# Patient Record
Sex: Male | Born: 1965 | Race: Black or African American | Hispanic: No | Smoking: Current every day smoker
Health system: Southern US, Community
[De-identification: ages and names within clinical notes are randomized; demographics above are authoritative.]

## PROBLEM LIST (undated history)

## (undated) DIAGNOSIS — R7303 Prediabetes: Secondary | ICD-10-CM

## (undated) DIAGNOSIS — J45909 Unspecified asthma, uncomplicated: Secondary | ICD-10-CM

## (undated) DIAGNOSIS — L409 Psoriasis, unspecified: Secondary | ICD-10-CM

## (undated) DIAGNOSIS — I10 Essential (primary) hypertension: Secondary | ICD-10-CM

## (undated) HISTORY — PX: LEG SURGERY: SHX1003

---

## 2005-05-15 ENCOUNTER — Emergency Department: Payer: Self-pay | Admitting: Emergency Medicine

## 2005-05-17 ENCOUNTER — Emergency Department: Payer: Self-pay | Admitting: Emergency Medicine

## 2007-05-22 ENCOUNTER — Emergency Department: Payer: Self-pay | Admitting: Emergency Medicine

## 2010-09-03 ENCOUNTER — Emergency Department: Payer: Self-pay | Admitting: Emergency Medicine

## 2012-01-10 ENCOUNTER — Emergency Department: Payer: Self-pay | Admitting: Emergency Medicine

## 2012-01-14 ENCOUNTER — Emergency Department: Payer: Self-pay | Admitting: Emergency Medicine

## 2012-01-14 LAB — CBC
HGB: 12.6 g/dL — ABNORMAL LOW (ref 13.0–18.0)
MCH: 31.1 pg (ref 26.0–34.0)
MCV: 95 fL (ref 80–100)
RBC: 4.03 10*6/uL — ABNORMAL LOW (ref 4.40–5.90)
RDW: 14 % (ref 11.5–14.5)
WBC: 4.7 10*3/uL (ref 3.8–10.6)

## 2012-01-14 LAB — BASIC METABOLIC PANEL WITH GFR
Anion Gap: 9
BUN: 8 mg/dL
Calcium, Total: 8.6 mg/dL
Chloride: 104 mmol/L
Co2: 27 mmol/L
Creatinine: 1.1 mg/dL
EGFR (African American): >60
EGFR (Non-African Amer.): >60
Glucose: 85 mg/dL
Osmolality: 277
Potassium: 4.3 mmol/L
Sodium: 140 mmol/L

## 2012-01-30 ENCOUNTER — Ambulatory Visit: Payer: Self-pay

## 2012-02-16 ENCOUNTER — Encounter: Payer: Self-pay | Admitting: Cardiothoracic Surgery

## 2012-03-29 DIAGNOSIS — E559 Vitamin D deficiency, unspecified: Secondary | ICD-10-CM | POA: Insufficient documentation

## 2012-03-29 DIAGNOSIS — Z125 Encounter for screening for malignant neoplasm of prostate: Secondary | ICD-10-CM | POA: Insufficient documentation

## 2012-03-29 DIAGNOSIS — F17200 Nicotine dependence, unspecified, uncomplicated: Secondary | ICD-10-CM | POA: Insufficient documentation

## 2012-03-29 DIAGNOSIS — I10 Essential (primary) hypertension: Secondary | ICD-10-CM | POA: Insufficient documentation

## 2012-03-29 DIAGNOSIS — Z833 Family history of diabetes mellitus: Secondary | ICD-10-CM | POA: Insufficient documentation

## 2012-03-29 DIAGNOSIS — Z8042 Family history of malignant neoplasm of prostate: Secondary | ICD-10-CM | POA: Insufficient documentation
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
## 2013-12-05 DIAGNOSIS — D649 Anemia, unspecified: Secondary | ICD-10-CM | POA: Insufficient documentation

## 2013-12-05 DIAGNOSIS — Z8371 Family history of colonic polyps: Secondary | ICD-10-CM | POA: Insufficient documentation

## 2015-06-17 DIAGNOSIS — L409 Psoriasis, unspecified: Secondary | ICD-10-CM | POA: Insufficient documentation

## 2015-06-17 DIAGNOSIS — L239 Allergic contact dermatitis, unspecified cause: Secondary | ICD-10-CM | POA: Insufficient documentation

## 2015-07-01 DIAGNOSIS — Z789 Other specified health status: Secondary | ICD-10-CM | POA: Insufficient documentation

## 2015-07-01 DIAGNOSIS — Z7289 Other problems related to lifestyle: Secondary | ICD-10-CM | POA: Insufficient documentation

## 2015-07-01 DIAGNOSIS — N529 Male erectile dysfunction, unspecified: Secondary | ICD-10-CM | POA: Insufficient documentation

## 2015-11-09 ENCOUNTER — Encounter: Payer: Self-pay | Admitting: Specialist

## 2015-11-09 ENCOUNTER — Inpatient Hospital Stay
Admission: EM | Admit: 2015-11-09 | Discharge: 2015-11-12 | DRG: 603 | Disposition: A | Discharge status: Home / Self Care | Payer: Self-pay | Attending: Internal Medicine | Admitting: Internal Medicine

## 2015-11-09 ENCOUNTER — Emergency Department: Payer: Self-pay

## 2015-11-09 DIAGNOSIS — L409 Psoriasis, unspecified: Secondary | ICD-10-CM | POA: Diagnosis present

## 2015-11-09 DIAGNOSIS — Z809 Family history of malignant neoplasm, unspecified: Secondary | ICD-10-CM

## 2015-11-09 DIAGNOSIS — F17200 Nicotine dependence, unspecified, uncomplicated: Secondary | ICD-10-CM | POA: Diagnosis present

## 2015-11-09 DIAGNOSIS — R21 Rash and other nonspecific skin eruption: Secondary | ICD-10-CM | POA: Diagnosis present

## 2015-11-09 DIAGNOSIS — B9789 Other viral agents as the cause of diseases classified elsewhere: Secondary | ICD-10-CM | POA: Diagnosis present

## 2015-11-09 DIAGNOSIS — L0390 Cellulitis, unspecified: Secondary | ICD-10-CM | POA: Diagnosis present

## 2015-11-09 DIAGNOSIS — I10 Essential (primary) hypertension: Secondary | ICD-10-CM | POA: Diagnosis present

## 2015-11-09 DIAGNOSIS — L03115 Cellulitis of right lower limb: Principal | ICD-10-CM | POA: Diagnosis present

## 2015-11-09 DIAGNOSIS — Z833 Family history of diabetes mellitus: Secondary | ICD-10-CM

## 2015-11-09 HISTORY — DX: Psoriasis, unspecified: L40.9

## 2015-11-09 HISTORY — DX: Essential (primary) hypertension: I10

## 2015-11-09 LAB — CBC WITH DIFFERENTIAL/PLATELET
Basophils Absolute: 0.1 10*3/uL (ref 0–0.1)
Basophils Relative: 1 %
Eosinophils Absolute: 0.1 10*3/uL (ref 0–0.7)
Eosinophils Relative: 1 %
HCT: 32.3 % — ABNORMAL LOW (ref 40.0–52.0)
Hemoglobin: 11 g/dL — ABNORMAL LOW (ref 13.0–18.0)
LYMPHS PCT: 20 %
Lymphs Abs: 1.4 10*3/uL (ref 1.0–3.6)
MCH: 30.4 pg (ref 26.0–34.0)
MCHC: 34 g/dL (ref 32.0–36.0)
MCV: 89.4 fL (ref 80.0–100.0)
MONO ABS: 0.9 10*3/uL (ref 0.2–1.0)
Monocytes Relative: 13 %
NEUTROS ABS: 4.4 10*3/uL (ref 1.4–6.5)
Neutrophils Relative %: 65 %
Platelets: 439 10*3/uL (ref 150–440)
RBC: 3.61 MIL/uL — AB (ref 4.40–5.90)
RDW: 13.8 % (ref 11.5–14.5)
WBC: 6.8 10*3/uL (ref 3.8–10.6)

## 2015-11-09 LAB — BASIC METABOLIC PANEL
ANION GAP: 5 (ref 5–15)
BUN: 6 mg/dL (ref 6–20)
CHLORIDE: 105 mmol/L (ref 101–111)
CO2: 26 mmol/L (ref 22–32)
Calcium: 8.6 mg/dL — ABNORMAL LOW (ref 8.9–10.3)
Creatinine, Ser: 0.63 mg/dL (ref 0.61–1.24)
GFR calc Af Amer: >60 mL/min (ref >60)
GFR calc non Af Amer: >60 mL/min (ref >60)
GLUCOSE: 87 mg/dL (ref 65–99)
POTASSIUM: 3.3 mmol/L — AB (ref 3.5–5.1)
Sodium: 136 mmol/L (ref 135–145)

## 2015-11-09 MED ORDER — ONDANSETRON HCL 4 MG/2ML IJ SOLN: 4.0000 mg | Freq: Four times a day (QID) | INTRAMUSCULAR | Status: DC | PRN

## 2015-11-09 MED ORDER — HYDROCODONE-ACETAMINOPHEN 5-325 MG PO TABS
1.0000 | ORAL_TABLET | ORAL | Status: DC | PRN
  Administered 2015-11-09 – 2015-11-11 (×2): 1 via ORAL
  Filled 2015-11-09: qty 1
  Filled 2015-11-09: qty 2

## 2015-11-09 MED ORDER — HYDROCHLOROTHIAZIDE 12.5 MG PO CAPS
12.5000 mg | ORAL_CAPSULE | Freq: Every day | ORAL | Status: DC
  Administered 2015-11-09 – 2015-11-11 (×3): 12.5 mg via ORAL
  Filled 2015-11-09 (×3): qty 1

## 2015-11-09 MED ORDER — ONDANSETRON HCL 4 MG PO TABS: 4.0000 mg | ORAL_TABLET | Freq: Four times a day (QID) | ORAL | Status: DC | PRN

## 2015-11-09 MED ORDER — TRIAMCINOLONE ACETONIDE 0.5 % EX CREA
1.0000 "application " | TOPICAL_CREAM | Freq: Three times a day (TID) | CUTANEOUS | Status: DC
  Administered 2015-11-09 – 2015-11-11 (×6): 1 via TOPICAL
  Filled 2015-11-09 (×2): qty 15

## 2015-11-09 MED ORDER — VANCOMYCIN HCL IN DEXTROSE 1-5 GM/200ML-% IV SOLN
1000.0000 mg | Freq: Two times a day (BID) | INTRAVENOUS | Status: DC
  Administered 2015-11-10: 1000 mg via INTRAVENOUS
  Filled 2015-11-09 (×3): qty 200

## 2015-11-09 MED ORDER — MORPHINE SULFATE (PF) 2 MG/ML IV SOLN
1.0000 mg | INTRAVENOUS | Status: DC | PRN
  Administered 2015-11-09: 1 mg via INTRAVENOUS
  Filled 2015-11-09: qty 1

## 2015-11-09 MED ORDER — ENOXAPARIN SODIUM 40 MG/0.4ML ~~LOC~~ SOLN
40.0000 mg | SUBCUTANEOUS | Status: DC
  Administered 2015-11-09 – 2015-11-11 (×3): 40 mg via SUBCUTANEOUS
  Filled 2015-11-09 (×3): qty 0.4

## 2015-11-09 MED ORDER — VANCOMYCIN HCL IN DEXTROSE 1-5 GM/200ML-% IV SOLN
1000.0000 mg | Freq: Once | INTRAVENOUS | Status: AC
  Administered 2015-11-09: 1000 mg via INTRAVENOUS
  Filled 2015-11-09: qty 200

## 2015-11-09 MED ORDER — ACETAMINOPHEN 325 MG PO TABS
650.0000 mg | ORAL_TABLET | Freq: Four times a day (QID) | ORAL | Status: DC | PRN
Start: 2015-11-09 — End: 2015-11-12

## 2015-11-09 MED ORDER — ACETAMINOPHEN 650 MG RE SUPP: 650.0000 mg | Freq: Four times a day (QID) | RECTAL | Status: DC | PRN

## 2015-11-09 NOTE — H&P (Signed)
[place] [facility] [place] NAME: Jonathan Garza    MR#:  [id number]  DATE OF BIRTH:  17-Jul-1966  DATE OF ADMISSION:  11/09/2015  PRIMARY CARE PHYSICIAN: [facility]   REQUESTING/REFERRING PHYSICIAN: Dr. Larae Grooms  CHIEF COMPLAINT:   Chief Complaint  Patient presents with  . Leg Swelling    HISTORY OF PRESENT ILLNESS:  Jonathan Garza  is a 50 y.o. male with a known history of psoriasis, hypertension who presents to the hospital due to right lower extremity redness swelling and pain progressively getting worse over the past week. he has history of psoriasis and has dry excoriating rash to his lower extremities, but for the last week he has had significant redness and swelling to his right lower extremity and therefore came to the ER for further evaluation. He denies any fevers chills, chest pain, shortness of breath, nausea, vomiting, cough, chills or any other associated symptoms. He underwent a ultrasound of his lower extremities which was negative for DVT. Clinically he does have evidence of right lower extremity cellulitis and therefore hospitalist services were contacted for further treatment and evaluation.  PAST MEDICAL HISTORY:   Past Medical History  Diagnosis Date  . Essential hypertension   . Psoriasis     PAST SURGICAL HISTORY:  No past surgical history on file.  SOCIAL HISTORY:   Social History  Substance Use Topics  . Smoking status: Not on file  . Smokeless tobacco: Not on file  . Alcohol Use: Not on file    FAMILY HISTORY:   Family History  Problem Relation Age of Onset  . Diabetes Father   . Diabetes Mother   . Cancer Father     DRUG ALLERGIES:  No Known Allergies  REVIEW OF SYSTEMS:   Review of Systems  Constitutional: Negative for fever and weight loss.  HENT: Negative for congestion, nosebleeds and tinnitus.   Eyes: Negative for blurred vision, double vision and redness.  Respiratory:  Negative for cough, hemoptysis and shortness of breath.   Cardiovascular: Positive for leg swelling (Right Leg). Negative for chest pain, orthopnea and PND.  Gastrointestinal: Negative for nausea, vomiting, abdominal pain, diarrhea and melena.  Genitourinary: Negative for dysuria, urgency and hematuria.  Musculoskeletal: Negative for joint pain and falls.  Neurological: Negative for dizziness, tingling, sensory change, focal weakness, seizures, weakness and headaches.  Endo/Heme/Allergies: Negative for polydipsia. Does not bruise/bleed easily.  Psychiatric/Behavioral: Negative for depression and memory loss. The patient is not nervous/anxious.     MEDICATIONS AT HOME:   Prior to Admission medications   Not on File      VITAL SIGNS:  Blood pressure 146/104, pulse 74, temperature 97.8 F (36.6 C), temperature source Oral, resp. rate 18, height 5\' 5"  (1.651 m), weight 69.854 kg (154 lb), SpO2 98 %.  PHYSICAL EXAMINATION:  Physical Exam  GENERAL:  50 y.o.-year-old patient lying in the bed in no acute distress.  EYES: Pupils equal, round, reactive to light and accommodation. No scleral icterus. Extraocular muscles intact.  HEENT: Head atraumatic, normocephalic. Oropharynx and nasopharynx clear. No oropharyngeal erythema, moist oral mucosa  NECK:  Supple, no jugular venous distention. No thyroid enlargement, no tenderness.  LUNGS: Normal breath sounds bilaterally, no wheezing, rales, rhonchi. No use of accessory muscles of respiration.  CARDIOVASCULAR: S1, S2 RRR. No murmurs, rubs, gallops, clicks.  ABDOMEN: Soft, nontender, nondistended. Bowel sounds present. No organomegaly or mass.  EXTREMITIES: RLE + 1 edema > Left, No  cyanosis, or clubbing. +  2 pedal & radial pulses b/l.   NEUROLOGIC: Cranial nerves II through XII are intact. No focal Motor or sensory deficits appreciated b/l PSYCHIATRIC: The patient is alert and oriented x 3. Good affect.  SKIN: Dry excoriating rash on the lower  extremities bilaterally. Extensively on the right heel and foot. No lesion, or ulcer.   LABORATORY PANEL:   CBC  Recent Labs Lab 11/09/15 1246  WBC 6.8  HGB 11.0*  HCT 32.3*  PLT 439   ------------------------------------------------------------------------------------------------------------------  Chemistries  No results for input(s): NA, K, CL, CO2, GLUCOSE, BUN, CREATININE, CALCIUM, MG, AST, ALT, ALKPHOS, BILITOT in the last 168 hours.  Invalid input(s): GFRCGP ------------------------------------------------------------------------------------------------------------------  Cardiac Enzymes No results for input(s): TROPONINI in the last 168 hours. ------------------------------------------------------------------------------------------------------------------  RADIOLOGY:  US Venous Img Lower Unilateral Right  11/09/2015  CLINICAL DATA:  50 year old male with swelling, redness and pain in the right lower extremity for the past week. EXAM: RIGHT LOWER EXTREMITY VENOUS DOPPLER ULTRASOUND TECHNIQUE: Gray-scale sonography with graded compression, as well as color Doppler and duplex ultrasound were performed to evaluate the lower extremity deep venous systems from the level of the common femoral vein and including the common femoral, femoral, profunda femoral, popliteal and calf veins including the posterior tibial, peroneal and gastrocnemius veins when visible. The superficial great saphenous vein was also interrogated. Spectral Doppler was utilized to evaluate flow at rest and with distal augmentation maneuvers in the common femoral, femoral and popliteal veins. COMPARISON:  No priors. FINDINGS: Contralateral Common Femoral Vein: Respiratory phasicity is normal and symmetric with the symptomatic side. No evidence of thrombus. Normal compressibility. Common Femoral Vein: No evidence of thrombus. Normal compressibility, respiratory phasicity and response to augmentation. Saphenofemoral  Junction: No evidence of thrombus. Normal compressibility and flow on color Doppler imaging. Profunda Femoral Vein: No evidence of thrombus. Normal compressibility and flow on color Doppler imaging. Femoral Vein: No evidence of thrombus. Normal compressibility, respiratory phasicity and response to augmentation. Popliteal Vein: No evidence of thrombus. Normal compressibility, respiratory phasicity and response to augmentation. Calf Veins: No evidence of thrombus. Normal compressibility and flow on color Doppler imaging. Superficial Great Saphenous Vein: No evidence of thrombus. Normal compressibility and flow on color Doppler imaging. Venous Reflux:  None. Other Findings: Nonocclusive echogenic material in the distal right greater saphenous vein, compatible nonocclusive superficial thrombus. IMPRESSION: 1. No evidence of right lower extremity deep venous thrombosis. 2. However, there does appear to be a small nonocclusive thrombus in the distal right greater saphenous vein. Electronically Signed   By: Vinnie Langton M.D.   On: 11/09/2015 12:33     IMPRESSION AND PLAN:   50 year old male with past medical history of hypertension, psoriasis who presents to the hospital due to right lower extremity pain and redness and swelling and noted to have a right lower extremity cellulitis.  #1 right lower extremity cellulitis-we will start the patient on IV vancomycin. -Follow blood and wound cultures. I will get a wound team consult. -Continue supportive care. Patient is afebrile with a normal white cell count.  #2 essential hypertension-continue HCTZ.  #3 psoriasis-patient likely needs referral to dermatology as an outpatient.   All the records are reviewed and case discussed with ED provider. Management plans discussed with the patient, family and they are in agreement.  CODE STATUS: Full  TOTAL TIME TAKING CARE OF THIS PATIENT: 45 minutes.    Henreitta Leber M.D on 11/09/2015 at 1:57 PM  Between  7am to 6pm - Pager - [phone]  After 6pm  go to [email or web address] - password [email or web address] [facility]  [facility]  Office  [phone]  CC: Primary care physician; No primary care provider on file.

## 2015-11-09 NOTE — ED Provider Notes (Signed)
[facility] Emergency Department Provider Note  ____________________________________________  Time seen: Approximately [id number] PM  I have reviewed the triage vital signs and the nursing notes.   HISTORY  Chief Complaint Leg Swelling    HPI Jonathan Garza is a 50 y.o. male with a history of psoriasis and hypertension who is presenting to the emergency department today with right lower extremity swelling and pain. He says that it started last week and was given ibuprofen and an outpatient office but the swelling and pain continued to worsen. He says that the swelling has extended all the way up to his groin at this point. He denies any fever. Says that he also has psoriasis to his bilateral feet which is worsened over the last 1-2 weeks. He says that in the past she has been prescribed a cream for this which has improved his symptoms.   No past medical history on file.  There are no active problems to display for this patient.   No past surgical history on file.  No current outpatient prescriptions on file.  Allergies Review of patient's allergies indicates no known allergies.  No family history on file.  Social History Social History  Substance Use Topics  . Smoking status: Not on file  . Smokeless tobacco: Not on file  . Alcohol Use: Not on file    Review of Systems Constitutional: No fever/chills Eyes: No visual changes. ENT: No sore throat. Cardiovascular: Denies chest pain. Respiratory: Denies shortness of breath. Gastrointestinal: No abdominal pain.  No nausea, no vomiting.  No diarrhea.  No constipation. Genitourinary: Negative for dysuria. Musculoskeletal: Negative for back pain. Skin: Redness and pain to the right lower extremity. Neurological: Negative for headaches, focal weakness or numbness.  10-point ROS otherwise negative.  ____________________________________________   PHYSICAL EXAM:  VITAL SIGNS: ED Triage Vitals  Enc  Vitals Group     BP 11/09/15 1132 146/104 mmHg     Pulse Rate 11/09/15 1132 74     Resp 11/09/15 1132 18     Temp 11/09/15 1132 97.8 F (36.6 C)     Temp Source 11/09/15 1132 Oral     SpO2 11/09/15 1132 98 %     Weight 11/09/15 1132 154 lb (69.854 kg)     Height 11/09/15 1132 5\' 5"  (1.651 m)     Head Cir --      Peak Flow --      Pain Score 11/09/15 1133 5     Pain Loc --      Pain Edu? --      Excl. in [place]? --     Constitutional: Alert and oriented. Well appearing and in no acute distress. Eyes: Conjunctivae are normal. PERRL. EOMI. Head: Atraumatic. Nose: No congestion/rhinnorhea. Mouth/Throat: Mucous membranes are moist.  Oropharynx non-erythematous. Neck: No stridor.   Cardiovascular: Normal rate, regular rhythm. Grossly normal heart sounds.  Good peripheral circulation. Respiratory: Normal respiratory effort.  No retractions. Lungs CTAB. Gastrointestinal: Soft and nontender. No distention. No abdominal bruits. No CVA tenderness. Musculoskeletal: Right lower extremity edematous foot as well as the calf. There is induration to the posterior calf as well as extending up the right posterior thigh into the groin. The erythema surrounds the entirety of the calf. It is tender to touch. No crepitus. Also warm to the touch. Neurologic:  Normal speech and language. No gross focal neurologic deficits are appreciated. No gait instability. Skin: See musculoskeletal exam. Also on the bilateral heels are plaque-like lesions extending around the heel.  They're white and disjointed. The patient says that these with lesions consistent with psoriasis. He also has several plaque-like lesions on the palms of his bilateral hands. Psychiatric: Mood and affect are normal. Speech and behavior are normal.  ____________________________________________   LABS (all labs ordered are listed, but only abnormal results are displayed)  Labs Reviewed  CBC WITH DIFFERENTIAL/PLATELET - Abnormal; Notable for the  following:    RBC 3.61 (*)    Hemoglobin 11.0 (*)    HCT 32.3 (*)    All other components within normal limits  BASIC METABOLIC PANEL   ____________________________________________  EKG   ____________________________________________  RADIOLOGY  Negative for DVT to the right lower extremity. Saphenous vein thrombus present. ____________________________________________   PROCEDURES    ____________________________________________   INITIAL IMPRESSION / ASSESSMENT AND PLAN / ED COURSE  Pertinent labs & imaging results that were available during my care of the patient were reviewed by me and considered in my medical decision making (see chart for details).  ----------------------------------------- 1:15 PM on 11/09/2015 -----------------------------------------  Patient is resting comfortably at this time. However, he has evidence of cellulitis greater than 50% of the right lower extremity. He will be admitted to the hospital. I discussed with the patient as well as family who are at the bedside. Signed out to Dr. Verdell Carmine of the medicine service. ____________________________________________   FINAL CLINICAL IMPRESSION(S) / ED DIAGNOSES  Right lower extremity cellulitis.    Orbie Pyo, MD 11/09/15 1315

## 2015-11-09 NOTE — ED Notes (Signed)
Pt reports right leg and calf swelling that started this week. Redness noted around shin and calf

## 2015-11-09 NOTE — Progress Notes (Signed)
Pharmacy Antibiotic Note  Jonathan Garza is a 50 y.o. male admitted on 11/09/2015 with cellulitis.  Pharmacy has been consulted for Vancomycin dosing.  Plan: Patient received Vancomycin 1000 mg IV once in ED at 1300.  Will order a stacked dose of 1000 mg IV once for 9 hours later.  Vancomycin 1000 IV every 12 hours.  Goal trough 10-15 mcg/mL.  Will order trough prior to 4th total dose (including stacked dose) on 3/12 at 2130 (should be at steady state).   Ke: 0.085, t1/2: 8.15 h, Vd: 48.3 L   Height: 5\' 5"  (165.1 cm) Weight: 154 lb (69.854 kg) IBW/kg (Calculated) : 61.5  Temp (24hrs), Avg:97.8 F (36.6 C), Min:97.8 F (36.6 C), Max:97.8 F (36.6 C)   Recent Labs Lab 11/09/15 1246  WBC 6.8  CREATININE 0.63    Estimated Creatinine Clearance: 97.2 mL/min (by C-G formula based on Cr of 0.63).    No Known Allergies  Antimicrobials this admission: 3/11 Vancomycin >>    Dose adjustments this admission: Trough on 3/12 at 2130  Microbiology results: No cultures pending at this time.  Thank you for allowing pharmacy to be a part of this patient's care.  Jonathan Garza G 11/09/2015 2:58 PM

## 2015-11-10 LAB — CBC
HCT: 29.9 % — ABNORMAL LOW (ref 40.0–52.0)
Hemoglobin: 10.3 g/dL — ABNORMAL LOW (ref 13.0–18.0)
MCH: 31.1 pg (ref 26.0–34.0)
MCHC: 34.5 g/dL (ref 32.0–36.0)
MCV: 90.3 fL (ref 80.0–100.0)
PLATELETS: 399 10*3/uL (ref 150–440)
RBC: 3.31 MIL/uL — AB (ref 4.40–5.90)
RDW: 13.7 % (ref 11.5–14.5)
WBC: 6.4 10*3/uL (ref 3.8–10.6)

## 2015-11-10 LAB — BASIC METABOLIC PANEL
Anion gap: 7 (ref 5–15)
BUN: 8 mg/dL (ref 6–20)
CALCIUM: 8.1 mg/dL — AB (ref 8.9–10.3)
CO2: 28 mmol/L (ref 22–32)
CREATININE: 0.63 mg/dL (ref 0.61–1.24)
Chloride: 98 mmol/L — ABNORMAL LOW (ref 101–111)
GFR calc non Af Amer: >60 mL/min (ref >60)
Glucose, Bld: 119 mg/dL — ABNORMAL HIGH (ref 65–99)
Potassium: 3 mmol/L — ABNORMAL LOW (ref 3.5–5.1)
SODIUM: 133 mmol/L — AB (ref 135–145)

## 2015-11-10 LAB — VANCOMYCIN, TROUGH: VANCOMYCIN TR: 9 ug/mL — AB (ref 10–20)

## 2015-11-10 MED ORDER — NICOTINE 21 MG/24HR TD PT24
21.0000 mg | MEDICATED_PATCH | Freq: Every day | TRANSDERMAL | Status: DC
  Administered 2015-11-10 – 2015-11-11 (×2): 21 mg via TRANSDERMAL
  Filled 2015-11-10 (×2): qty 1

## 2015-11-10 MED ORDER — VANCOMYCIN HCL 10 G IV SOLR
1250.0000 mg | Freq: Two times a day (BID) | INTRAVENOUS | Status: DC
  Administered 2015-11-11 (×2): 1250 mg via INTRAVENOUS
  Filled 2015-11-10 (×3): qty 1250

## 2015-11-10 MED ORDER — LABETALOL HCL 5 MG/ML IV SOLN
10.0000 mg | INTRAVENOUS | Status: DC | PRN
  Filled 2015-11-10: qty 4

## 2015-11-10 NOTE — Progress Notes (Addendum)
Manual blood pressure 146/104. Dr Jannifer Franklin notified. Order will be placed for prn med

## 2015-11-10 NOTE — Progress Notes (Signed)
[place] at [place] NAME: Jonathan Garza    MR#:  [id number]  DATE OF BIRTH:  1966/03/25  SUBJECTIVE:  CHIEF COMPLAINT:   Chief Complaint  Patient presents with  . Leg Swelling     Have cellulitis on right leg, IV Vanc started, helped some, still redness and warm present.  REVIEW OF SYSTEMS:  CONSTITUTIONAL: No fever, fatigue or weakness.  EYES: No blurred or double vision.  EARS, NOSE, AND THROAT: No tinnitus or ear pain.  RESPIRATORY: No cough, shortness of breath, wheezing or hemoptysis.  CARDIOVASCULAR: No chest pain, orthopnea, edema.  GASTROINTESTINAL: No nausea, vomiting, diarrhea or abdominal pain.  GENITOURINARY: No dysuria, hematuria.  ENDOCRINE: No polyuria, nocturia,  HEMATOLOGY: No anemia, easy bruising or bleeding SKIN: No rash or lesion. MUSCULOSKELETAL: No joint pain or arthritis.  Right leg swelling and redness. NEUROLOGIC: No tingling, numbness, weakness.  PSYCHIATRY: No anxiety or depression.   ROS  DRUG ALLERGIES:  No Known Allergies  VITALS:  Blood pressure 151/103, pulse 69, temperature 99.3 F (37.4 C), temperature source Oral, resp. rate 18, height 5\' 5"  (1.651 m), weight 69.854 kg (154 lb), SpO2 100 %.  PHYSICAL EXAMINATION:   GENERAL: 50 y.o.-year-old patient lying in the bed in no acute distress.  EYES: Pupils equal, round, reactive to light and accommodation. No scleral icterus. Extraocular muscles intact.  HEENT: Head atraumatic, normocephalic. Oropharynx and nasopharynx clear. No oropharyngeal erythema, moist oral mucosa  NECK: Supple, no jugular venous distention. No thyroid enlargement, no tenderness.  LUNGS: Normal breath sounds bilaterally, no wheezing, rales, rhonchi. No use of accessory muscles of respiration.  CARDIOVASCULAR: S1, S2 RRR. No murmurs, rubs, gallops, clicks.  ABDOMEN: Soft, nontender, nondistended. Bowel sounds present. No organomegaly or mass.  EXTREMITIES:  RLE + 1 edema > Left, No cyanosis, or clubbing. + 2 pedal & radial pulses b/l.  NEUROLOGIC: Cranial nerves II through XII are intact. No focal Motor or sensory deficits appreciated b/l PSYCHIATRIC: The patient is alert and oriented x 3. Good affect.  SKIN: Dry excoriating rash on the lower extremities bilaterally. Extensively on the right heel and foot. No lesion, or ulcer.   Physical Exam LABORATORY PANEL:   CBC  Recent Labs Lab 11/10/15 0328  WBC 6.4  HGB 10.3*  HCT 29.9*  PLT 399   ------------------------------------------------------------------------------------------------------------------  Chemistries   Recent Labs Lab 11/10/15 0328  NA 133*  K 3.0*  CL 98*  CO2 28  GLUCOSE 119*  BUN 8  CREATININE 0.63  CALCIUM 8.1*   ------------------------------------------------------------------------------------------------------------------  Cardiac Enzymes No results for input(s): TROPONINI in the last 168 hours. ------------------------------------------------------------------------------------------------------------------  RADIOLOGY:  US Venous Img Lower Unilateral Right  11/09/2015  CLINICAL DATA:  50 year old male with swelling, redness and pain in the right lower extremity for the past week. EXAM: RIGHT LOWER EXTREMITY VENOUS DOPPLER ULTRASOUND TECHNIQUE: Gray-scale sonography with graded compression, as well as color Doppler and duplex ultrasound were performed to evaluate the lower extremity deep venous systems from the level of the common femoral vein and including the common femoral, femoral, profunda femoral, popliteal and calf veins including the posterior tibial, peroneal and gastrocnemius veins when visible. The superficial great saphenous vein was also interrogated. Spectral Doppler was utilized to evaluate flow at rest and with distal augmentation maneuvers in the common femoral, femoral and popliteal veins. COMPARISON:  No priors. FINDINGS:  Contralateral Common Femoral Vein: Respiratory phasicity is normal and symmetric with the symptomatic side. No evidence of thrombus. Normal  compressibility. Common Femoral Vein: No evidence of thrombus. Normal compressibility, respiratory phasicity and response to augmentation. Saphenofemoral Junction: No evidence of thrombus. Normal compressibility and flow on color Doppler imaging. Profunda Femoral Vein: No evidence of thrombus. Normal compressibility and flow on color Doppler imaging. Femoral Vein: No evidence of thrombus. Normal compressibility, respiratory phasicity and response to augmentation. Popliteal Vein: No evidence of thrombus. Normal compressibility, respiratory phasicity and response to augmentation. Calf Veins: No evidence of thrombus. Normal compressibility and flow on color Doppler imaging. Superficial Great Saphenous Vein: No evidence of thrombus. Normal compressibility and flow on color Doppler imaging. Venous Reflux:  None. Other Findings: Nonocclusive echogenic material in the distal right greater saphenous vein, compatible nonocclusive superficial thrombus. IMPRESSION: 1. No evidence of right lower extremity deep venous thrombosis. 2. However, there does appear to be a small nonocclusive thrombus in the distal right greater saphenous vein. Electronically Signed   By: Vinnie Langton M.D.   On: 11/09/2015 12:33    ASSESSMENT AND PLAN:   Active Problems:   Cellulitis  #1 right lower extremity cellulitis-  on IV vancomycin. -Follow blood and wound cultures.   wound team consult. -Continue supportive care. Patient is afebrile with a normal white cell count.    No DVT.  #2 essential hypertension-continue HCTZ.  #3 psoriasis-patient likely needs referral to dermatology as an outpatient.   All the records are reviewed and case discussed with Care Management/Social Workerr. Management plans discussed with the patient, family and they are in agreement.  CODE STATUS:  full  TOTAL TIME TAKING CARE OF THIS PATIENT: 35 minutes.   POSSIBLE D/C IN 1-2 DAYS, DEPENDING ON CLINICAL CONDITION.   Vaughan Basta M.D on 11/10/2015   Between 7am to 6pm - Pager - [phone]  After 6pm go to [email or web address] - password [email or web address] [facility]  [facility]  Office  [phone]  CC: Primary care physician; No primary care provider on file.  Note: This dictation was prepared with Dragon dictation along with smaller phrase technology. Any transcriptional errors that result from this process are unintentional.

## 2015-11-10 NOTE — Progress Notes (Signed)
Notifired Matt in pharmacy vanc trough of 9

## 2015-11-10 NOTE — Progress Notes (Signed)
Pt sodium 133, K+ 3.0. Dr. Anselm Jungling notified, will place orders when assessed.

## 2015-11-10 NOTE — Progress Notes (Signed)
Pharmacy Antibiotic Note  Jonathan Garza is a 50 y.o. male admitted on 11/09/2015 with cellulitis.  Pharmacy has been consulted for Vancomycin dosing.  Plan: Patient received Vancomycin 1000 mg IV once in ED at 1300.  Will order a stacked dose of 1000 mg IV once for 9 hours later.  Vancomycin 1000 IV every 12 hours.  Goal trough 10-15 mcg/mL.  Will order trough prior to 4th total dose (including stacked dose) on 3/12 at 2130 (should be at steady state).   3/12 PM vanc level 9. Changed to 1250 mg q 12 hours. Level before 4th new dose.  Ke: 0.085, t1/2: 8.15 h, Vd: 48.3 L   Height: 5\' 5"  (165.1 cm) Weight: 154 lb (69.854 kg) IBW/kg (Calculated) : 61.5  Temp (24hrs), Avg:98.6 F (37 C), Min:98 F (36.7 C), Max:99.3 F (37.4 C)   Recent Labs Lab 11/09/15 1246 11/10/15 0328 11/10/15 2124  WBC 6.8 6.4  --   CREATININE 0.63 0.63  --   VANCOTROUGH  --   --  9*    Estimated Creatinine Clearance: 97.2 mL/min (by C-G formula based on Cr of 0.63).    No Known Allergies  Antimicrobials this admission: 3/11 Vancomycin >>    Dose adjustments this admission: Trough on 3/12 at 2130  Microbiology results: No cultures pending at this time.  Thank you for allowing pharmacy to be a part of this patient's care.  Mallika Sanmiguel S 11/10/2015 10:41 PM

## 2015-11-11 LAB — BASIC METABOLIC PANEL
ANION GAP: 6 (ref 5–15)
BUN: 10 mg/dL (ref 6–20)
CALCIUM: 8.7 mg/dL — AB (ref 8.9–10.3)
CHLORIDE: 97 mmol/L — AB (ref 101–111)
CO2: 30 mmol/L (ref 22–32)
Creatinine, Ser: 0.72 mg/dL (ref 0.61–1.24)
GFR calc Af Amer: >60 mL/min (ref >60)
GFR calc non Af Amer: >60 mL/min (ref >60)
GLUCOSE: 102 mg/dL — AB (ref 65–99)
Potassium: 3.7 mmol/L (ref 3.5–5.1)
Sodium: 133 mmol/L — ABNORMAL LOW (ref 135–145)

## 2015-11-11 MED ORDER — NICOTINE 21 MG/24HR TD PT24: 21.0000 mg | MEDICATED_PATCH | Freq: Every day | TRANSDERMAL | Status: DC

## 2015-11-11 MED ORDER — AMOXICILLIN-POT CLAVULANATE 500-125 MG PO TABS
1.0000 | ORAL_TABLET | Freq: Three times a day (TID) | ORAL | Status: DC
Start: 2015-11-11 — End: 2015-11-12
  Administered 2015-11-11 – 2015-11-12 (×3): 500 mg via ORAL
  Filled 2015-11-11 (×3): qty 1

## 2015-11-11 NOTE — Progress Notes (Signed)
[place] at [place] NAME: Jonathan Garza    MR#:  [id number]  DATE OF BIRTH:  17-Jun-1966  SUBJECTIVE:  CHIEF COMPLAINT:   Chief Complaint  Patient presents with  . Leg Swelling     Have cellulitis on right leg, IV Vanc started, Clinically improving, still with some right leg swelling and pain  REVIEW OF SYSTEMS:  CONSTITUTIONAL: No fever, fatigue or weakness.  EYES: No blurred or double vision.  EARS, NOSE, AND THROAT: No tinnitus or ear pain.  RESPIRATORY: No cough, shortness of breath, wheezing or hemoptysis.  CARDIOVASCULAR: No chest pain, orthopnea, edema.  GASTROINTESTINAL: No nausea, vomiting, diarrhea or abdominal pain.  GENITOURINARY: No dysuria, hematuria.  ENDOCRINE: No polyuria, nocturia,  HEMATOLOGY: No anemia, easy bruising or bleeding SKIN: No rash or lesion. MUSCULOSKELETAL: No joint pain or arthritis.  Right leg swelling and redness. NEUROLOGIC: No tingling, numbness, weakness.  PSYCHIATRY: No anxiety or depression.   ROS  DRUG ALLERGIES:  No Known Allergies  VITALS:  Blood pressure 130/87, pulse 66, temperature 98.4 F (36.9 C), temperature source Oral, resp. rate 18, height 5\' 5"  (1.651 m), weight 69.854 kg (154 lb), SpO2 97 %.  PHYSICAL EXAMINATION:   GENERAL: 50 y.o.-year-old patient lying in the bed in no acute distress.  EYES: Pupils equal, round, reactive to light and accommodation. No scleral icterus. Extraocular muscles intact.  HEENT: Head atraumatic, normocephalic. Oropharynx and nasopharynx clear. No oropharyngeal erythema, moist oral mucosa  NECK: Supple, no jugular venous distention. No thyroid enlargement, no tenderness.  LUNGS: Normal breath sounds bilaterally, no wheezing, rales, rhonchi. No use of accessory muscles of respiration.  CARDIOVASCULAR: S1, S2 RRR. No murmurs, rubs, gallops, clicks.  ABDOMEN: Soft, nontender, nondistended. Bowel sounds present. No organomegaly or  mass.  EXTREMITIES: RLE + 1 edema > Left, No cyanosis, or clubbing. + 2 pedal & radial pulses b/l.  NEUROLOGIC: Cranial nerves II through XII are intact. No focal Motor or sensory deficits appreciated b/l PSYCHIATRIC: The patient is alert and oriented x 3. Good affect.  SKIN: Dry excoriating rash on the lower extremities bilaterally. Extensively on the right heel and foot. No lesion, or ulcer.   Physical Exam LABORATORY PANEL:   CBC  Recent Labs Lab 11/10/15 0328  WBC 6.4  HGB 10.3*  HCT 29.9*  PLT 399   ------------------------------------------------------------------------------------------------------------------  Chemistries   Recent Labs Lab 11/10/15 0328  NA 133*  K 3.0*  CL 98*  CO2 28  GLUCOSE 119*  BUN 8  CREATININE 0.63  CALCIUM 8.1*   ------------------------------------------------------------------------------------------------------------------  Cardiac Enzymes No results for input(s): TROPONINI in the last 168 hours. ------------------------------------------------------------------------------------------------------------------  RADIOLOGY:  No results found.  ASSESSMENT AND PLAN:   Active Problems:   Cellulitis  #1 right lower extremity cellulitis-  Clinically improving ,on IV vancomycin changed to by mouth -Could not find any blood and wound cultures.   wound team consult. -Continue supportive care. Patient is afebrile with a normal white cell count.    No DVT.  #2 essential hypertension-continue HCTZ.  #3 psoriasis-patient likely needs outpatient dermatology follow-up    #tobacco abuse disorder Counseled patient to quit smoking for 3-4 minutes. He is planning to quit smoking and requested nicotine patch. Start nicotine patch     All the recor follow-upds are reviewed and case discussed with Care Management/Social Workerr. Management plans discussed with the patient, family and they are in agreement.  CODE STATUS:  full  TOTAL TIME TAKING CARE OF THIS  PATIENT: 33 minutes.   POSSIBLE D/C IN am DAYS, DEPENDING ON CLINICAL CONDITION.   Nicholes Mango M.D on 11/11/2015   Between 7am to 6pm - Pager - [phone]  After 6pm go to [email or web address] - password [email or web address] [facility]  [place] [facility]  Office  [phone]  CC: Primary care physician; No primary care provider on file.  Note: This dictation was prepared with Dragon dictation along with smaller phrase technology. Any transcriptional errors that result from this process are unintentional.

## 2015-11-11 NOTE — Care Management Note (Signed)
Case Management Note  Patient Details  Name: Jonathan Garza MRN: [id number] Date of Birth: [birth year]  Subjective/Objective:        Uninsured 50yo Mr Jonathan Garza was admitted 11/09/15 with a diagnosis of right leg cellulitis.  He resides with his Mother who can provide transportation to appointments. PCP=[facility] [employer] [facility]. Pharmacy= [facility]. He has no home assistive equipment,no home health services except for a pair of crutches, and no home oxygen. He reports that he has an appointment with an unknown podiatrist in May 2017. Case management will follow for discharge planning.            Action/Plan:   Expected Discharge Date:                  Expected Discharge Plan:     In-House Referral:     Discharge planning Services     Post Acute Care Choice:    Choice offered to:     DME Arranged:    DME Agency:     HH Arranged:    [place] Agency:     Status of Service:     Medicare Important Message Given:    Date Medicare IM Given:    Medicare IM give by:    Date Additional Medicare IM Given:    Additional Medicare Important Message give by:     If discussed at [place] of Stay Meetings, dates discussed:    Additional Comments:  Berlie Hatchel A, RN 11/11/2015, 3:04 PM

## 2015-11-12 MED ORDER — TRIAMCINOLONE ACETONIDE 0.5 % EX CREA: 1.0000 "application " | TOPICAL_CREAM | Freq: Two times a day (BID) | CUTANEOUS | Status: DC

## 2015-11-12 MED ORDER — NICOTINE 21 MG/24HR TD PT24: 21.0000 mg | MEDICATED_PATCH | Freq: Every day | TRANSDERMAL | Status: DC

## 2015-11-12 MED ORDER — ACETAMINOPHEN 325 MG PO TABS: 650.0000 mg | ORAL_TABLET | Freq: Four times a day (QID) | ORAL | Status: DC | PRN

## 2015-11-12 MED ORDER — AMOXICILLIN-POT CLAVULANATE 500-125 MG PO TABS: 1.0000 | ORAL_TABLET | Freq: Three times a day (TID) | ORAL | Status: AC

## 2015-11-12 NOTE — Progress Notes (Signed)
[place], [place].   11/12/2015  Patient: Jonathan Garza   Date of Birth:  [birth year]  Date of admission:  11/09/2015  Date of Discharge  11/12/2015    To Whom it May Concern:   Lula Portocarrero  may return to work on 11/12/15 evening shift.  PHYSICAL ACTIVITY:  Full  If you have any questions or concerns, please don't hesitate to call.  Sincerely,   Nicholes Mango M.D Pager [phone] Office : [phone]   .

## 2015-11-12 NOTE — Discharge Summary (Signed)
[place] at [place] NAME: Jonathan Garza    MR#:  [id number]  DATE OF BIRTH:  Sep 17, 1965  DATE OF ADMISSION:  11/09/2015 ADMITTING PHYSICIAN: Henreitta Leber, MD  DATE OF DISCHARGE: 11/12/2015 PRIMARY CARE PHYSICIAN: No primary care provider on file.    ADMISSION DIAGNOSIS:  Cellulitis of right lower extremity [id number]  DISCHARGE DIAGNOSIS:  Active Problems:   Cellulitis   SECONDARY DIAGNOSIS:   Past Medical History  Diagnosis Date  . Essential hypertension   . Psoriasis     HOSPITAL COURSE:   1 right lower extremity cellulitis- Clinically improving ,on IV vancomycin changed to by mouth augmentin  -Could not find any blood and wound cultures. -Continue supportive care. Patient is afebrile with a normal white cell count.   No DVT.  #2 essential hypertension-continue HCTZ.  #3 psoriasis-patient likely needs outpatient dermatology follow-up   #tobacco abuse disorder Counseled patient to quit smoking for 3-4 minutes. He is planning to quit smoking and requested nicotine patch. Start nicotine patch   DISCHARGE CONDITIONS:   fair  CONSULTS OBTAINED:  Treatment Team:  Nicholes Mango, MD   PROCEDURES none  DRUG ALLERGIES:  No Known Allergies  DISCHARGE MEDICATIONS:   Current Discharge Medication List    START taking these medications   Details  acetaminophen (TYLENOL) 325 MG tablet Take 2 tablets (650 mg total) by mouth every 6 (six) hours as needed for mild pain (or Fever >/= 101).    amoxicillin-clavulanate (AUGMENTIN) 500-125 MG tablet Take 1 tablet (500 mg total) by mouth every 8 (eight) hours. Qty: 15 tablet, Refills: 0    nicotine (NICODERM CQ - DOSED IN MG/24 HOURS) 21 mg/24hr patch Place 1 patch (21 mg total) onto the skin daily. Qty: 28 patch, Refills: 0      CONTINUE these medications which have CHANGED   Details  triamcinolone cream (KENALOG) 0.5 % Apply 1 application topically 2 (two)  times daily. Qty: 30 g, Refills: 2      CONTINUE these medications which have NOT CHANGED   Details  hydrochlorothiazide (MICROZIDE) 12.5 MG capsule Take 12.5 mg by mouth daily. Refills: 1    ibuprofen (ADVIL,MOTRIN) 600 MG tablet Take 600 mg by mouth every 6 (six) hours as needed. for pain Refills: 2         DISCHARGE INSTRUCTIONS:   Activity as tolerated Low-salt diet Follow-up with primary care physician in a week   DIET:  Low salt  DISCHARGE CONDITION:  Fair  ACTIVITY:  Activity as tolerated  OXYGEN:  Home Oxygen: No.   Oxygen Delivery: room air  DISCHARGE LOCATION:  home   If you experience worsening of your admission symptoms, develop shortness of breath, life threatening emergency, suicidal or homicidal thoughts you must seek medical attention immediately by calling 911 or calling your MD immediately  if symptoms less severe.  You Must read complete instructions/literature along with all the possible adverse reactions/side effects for all the Medicines you take and that have been prescribed to you. Take any new Medicines after you have completely understood and accpet all the possible adverse reactions/side effects.   Please note  You were cared for by a hospitalist during your hospital stay. If you have any questions about your discharge medications or the care you received while you were in the hospital after you are discharged, you can call the unit and asked to speak with the hospitalist on call if the hospitalist that took care of you  is not available. Once you are discharged, your primary care physician will handle any further medical issues. Please note that NO REFILLS for any discharge medications will be authorized once you are discharged, as it is imperative that you return to your primary care physician (or establish a relationship with a primary care physician if you do not have one) for your aftercare needs so that they can reassess your need for  medications and monitor your lab values.     Today  Chief Complaint  Patient presents with  . Leg Swelling   Patient is doing fine. Redness and swelling in the left leg improved. Ambulating fine per RN and wants to be discharged home   ROS:  CONSTITUTIONAL: Denies fevers, chills. Denies any fatigue, weakness.  EYES: Denies blurry vision, double vision, eye pain. EARS, NOSE, THROAT: Denies tinnitus, ear pain, hearing loss. RESPIRATORY: Denies cough, wheeze, shortness of breath.  CARDIOVASCULAR: Denies chest pain, palpitations, edema.  GASTROINTESTINAL: Denies nausea, vomiting, diarrhea, abdominal pain. Denies bright red blood per rectum. GENITOURINARY: Denies dysuria, hematuria. ENDOCRINE: Denies nocturia or thyroid problems. HEMATOLOGIC AND LYMPHATIC: Denies easy bruising or bleeding. SKIN: RLE erythema, edema and tenderness are significantly improved . Positive psoriatic patches  MUSCULOSKELETAL: Denies pain in neck, back, shoulder, knees, hips or arthritic symptoms.  NEUROLOGIC: Denies paralysis, paresthesias.  PSYCHIATRIC: Denies anxiety or depressive symptoms.   VITAL SIGNS:  Blood pressure 129/91, pulse 60, temperature 98.2 F (36.8 C), temperature source Oral, resp. rate 18, height 5\' 5"  (1.651 m), weight 69.854 kg (154 lb), SpO2 100 %.  I/O:   Intake/Output Summary (Last 24 hours) at 11/12/15 1320 Last data filed at 11/12/15 1300  Gross per 24 hour  Intake   1200 ml  Output   3060 ml  Net  -1860 ml    PHYSICAL EXAMINATION:  GENERAL:  50 y.o.-year-old patient lying in the bed with no acute distress.  EYES: Pupils equal, round, reactive to light and accommodation. No scleral icterus. Extraocular muscles intact.  HEENT: Head atraumatic, normocephalic. Oropharynx and nasopharynx clear.  NECK:  Supple, no jugular venous distention. No thyroid enlargement, no tenderness.  LUNGS: Normal breath sounds bilaterally, no wheezing, rales,rhonchi or crepitation. No use of  accessory muscles of respiration.  CARDIOVASCULAR: S1, S2 normal. No murmurs, rubs, or gallops.  ABDOMEN: Soft, non-tender, non-distended. Bowel sounds present. No organomegaly or mass.  EXTREMITIES: No pedal edema, cyanosis, or clubbing.  NEUROLOGIC: Cranial nerves II through XII are intact. Muscle strength 5/5 in all extremities. Sensation intact. Gait not checked.  PSYCHIATRIC: The patient is alert and oriented x 3.  SKIN:  RLE erythema, edema and tenderness are significantly improved . Positive psoriatic patches  DATA REVIEW:   CBC  Recent Labs Lab 11/10/15 0328  WBC 6.4  HGB 10.3*  HCT 29.9*  PLT 399    Chemistries   Recent Labs Lab 11/11/15 1436  NA 133*  K 3.7  CL 97*  CO2 30  GLUCOSE 102*  BUN 10  CREATININE 0.72  CALCIUM 8.7*    Cardiac Enzymes No results for input(s): TROPONINI in the last 168 hours.  Microbiology Results  No results found for this or any previous visit.  RADIOLOGY:  US Venous Img Lower Unilateral Right  11/09/2015  CLINICAL DATA:  50 year old male with swelling, redness and pain in the right lower extremity for the past week. EXAM: RIGHT LOWER EXTREMITY VENOUS DOPPLER ULTRASOUND TECHNIQUE: Gray-scale sonography with graded compression, as well as color Doppler and duplex ultrasound were performed to  evaluate the lower extremity deep venous systems from the level of the common femoral vein and including the common femoral, femoral, profunda femoral, popliteal and calf veins including the posterior tibial, peroneal and gastrocnemius veins when visible. The superficial great saphenous vein was also interrogated. Spectral Doppler was utilized to evaluate flow at rest and with distal augmentation maneuvers in the common femoral, femoral and popliteal veins. COMPARISON:  No priors. FINDINGS: Contralateral Common Femoral Vein: Respiratory phasicity is normal and symmetric with the symptomatic side. No evidence of thrombus. Normal compressibility.  Common Femoral Vein: No evidence of thrombus. Normal compressibility, respiratory phasicity and response to augmentation. Saphenofemoral Junction: No evidence of thrombus. Normal compressibility and flow on color Doppler imaging. Profunda Femoral Vein: No evidence of thrombus. Normal compressibility and flow on color Doppler imaging. Femoral Vein: No evidence of thrombus. Normal compressibility, respiratory phasicity and response to augmentation. Popliteal Vein: No evidence of thrombus. Normal compressibility, respiratory phasicity and response to augmentation. Calf Veins: No evidence of thrombus. Normal compressibility and flow on color Doppler imaging. Superficial Great Saphenous Vein: No evidence of thrombus. Normal compressibility and flow on color Doppler imaging. Venous Reflux:  None. Other Findings: Nonocclusive echogenic material in the distal right greater saphenous vein, compatible nonocclusive superficial thrombus. IMPRESSION: 1. No evidence of right lower extremity deep venous thrombosis. 2. However, there does appear to be a small nonocclusive thrombus in the distal right greater saphenous vein. Electronically Signed   By: Vinnie Langton M.D.   On: 11/09/2015 12:33    EKG:  No orders found for this or any previous visit.    Management plans discussed with the patient, family and they are in agreement.  CODE STATUS:     Code Status Orders        Start     Ordered   11/09/15 1507  Full code   Continuous     11/09/15 1506    Code Status History    Date Active Date Inactive Code Status Order ID Comments User Context   This patient has a current code status but no historical code status.      TOTAL TIME TAKING CARE OF THIS PATIENT: 45 minutes.    @[facility] @  on 11/12/2015 at 1:20 PM  Between 7am to 6pm - Pager - [phone]  After 6pm go to [email or web address] - password [email or web address] [facility]  [place] [facility]  Office  [phone]  CC: Primary care physician; No primary care  provider on file.

## 2015-11-12 NOTE — Discharge Instructions (Signed)
Activity as tolerated Low-salt diet Follow-up with primary care physician in a week

## 2015-12-18 DIAGNOSIS — S143XXA Injury of brachial plexus, initial encounter: Secondary | ICD-10-CM | POA: Insufficient documentation

## 2015-12-18 DIAGNOSIS — T1490XA Injury, unspecified, initial encounter: Secondary | ICD-10-CM | POA: Insufficient documentation

## 2015-12-23 DIAGNOSIS — S52301A Unspecified fracture of shaft of right radius, initial encounter for closed fracture: Secondary | ICD-10-CM | POA: Insufficient documentation

## 2015-12-23 DIAGNOSIS — S62639B Displaced fracture of distal phalanx of unspecified finger, initial encounter for open fracture: Secondary | ICD-10-CM | POA: Insufficient documentation

## 2015-12-23 DIAGNOSIS — S51001A Unspecified open wound of right elbow, initial encounter: Secondary | ICD-10-CM | POA: Insufficient documentation

## 2015-12-23 DIAGNOSIS — S42131A Displaced fracture of coracoid process, right shoulder, initial encounter for closed fracture: Secondary | ICD-10-CM | POA: Insufficient documentation

## 2016-08-31 HISTORY — PX: FRACTURE SURGERY: SHX138

## 2017-01-12 ENCOUNTER — Other Ambulatory Visit: Payer: Self-pay | Admitting: Nurse Practitioner

## 2017-01-12 ENCOUNTER — Ambulatory Visit
Admission: RE | Admit: 2017-01-12 | Discharge: 2017-01-12 | Disposition: A | Discharge status: Home / Self Care | Payer: Medicaid Other | Source: Ambulatory Visit | Attending: Nurse Practitioner | Admitting: Nurse Practitioner

## 2017-01-12 DIAGNOSIS — R1031 Right lower quadrant pain: Secondary | ICD-10-CM | POA: Insufficient documentation

## 2017-06-08 IMAGING — US US EXTREM LOW VENOUS*R*
1 series · 13 of 24 positions shown · non-contrast
Comparison: No priors.

CLINICAL DATA: 49-year-old male with swelling, redness and pain in
the right lower extremity for the past week.



[Series 1: us extrem low venous*right* · 0.08mm/px · 13 of 36 slices shown]
[im 1/36]
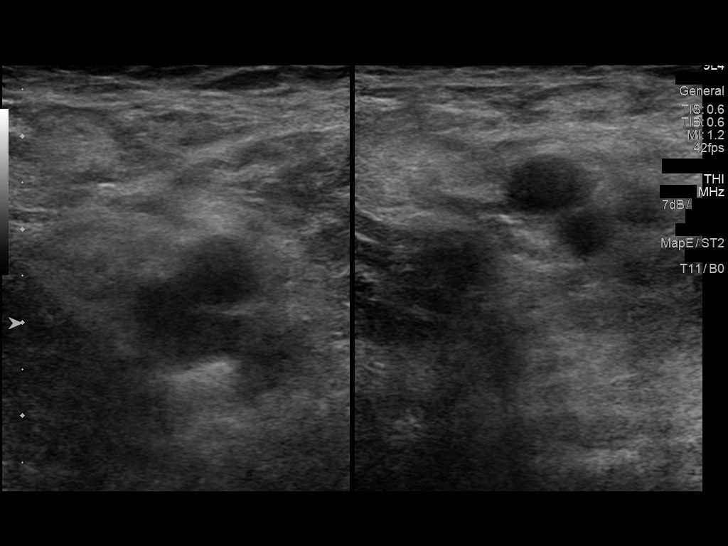
[im 4/36]
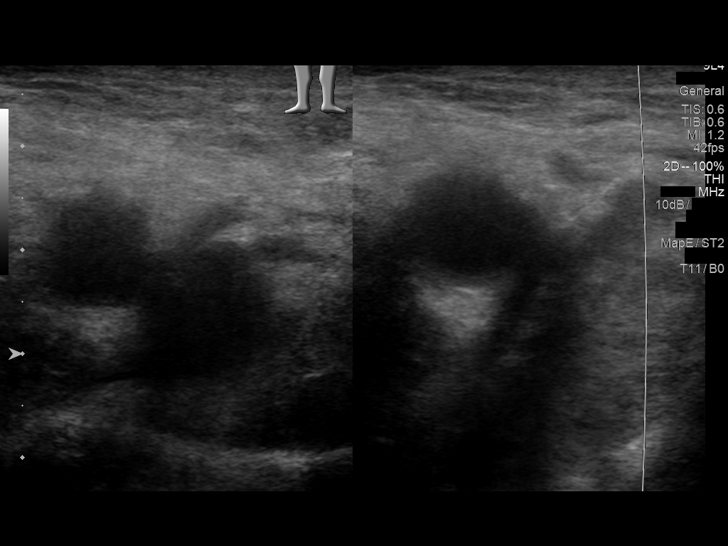
[im 7/36]
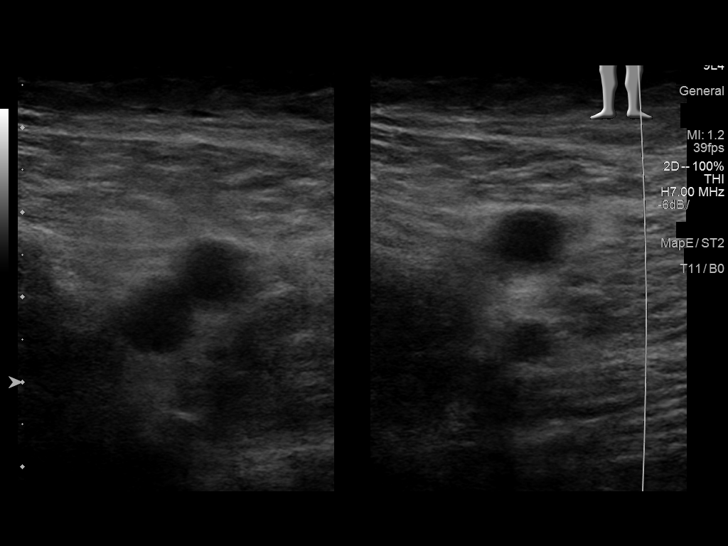
[im 10/36]
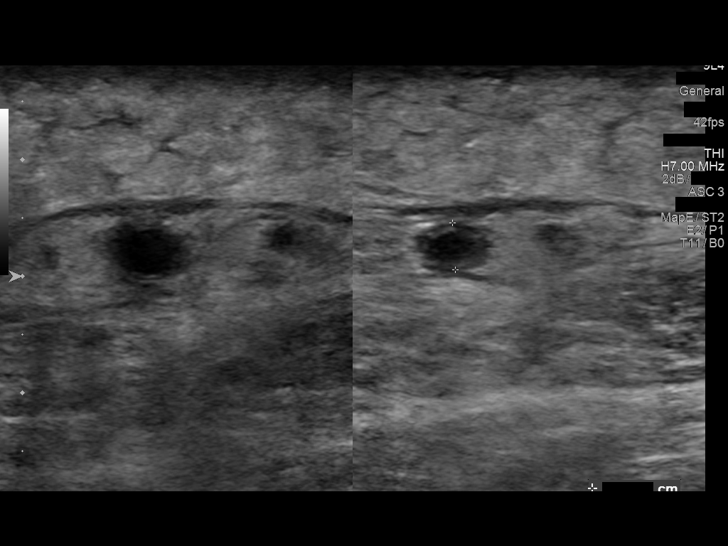
[im 13/36]
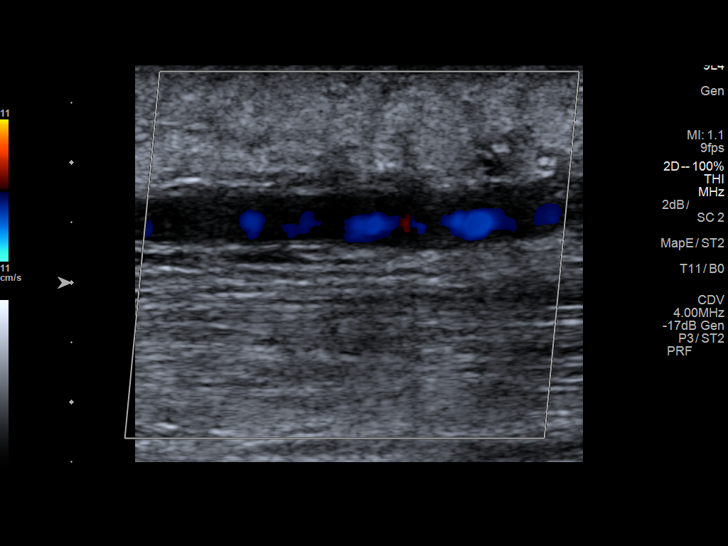
[im 16/36]
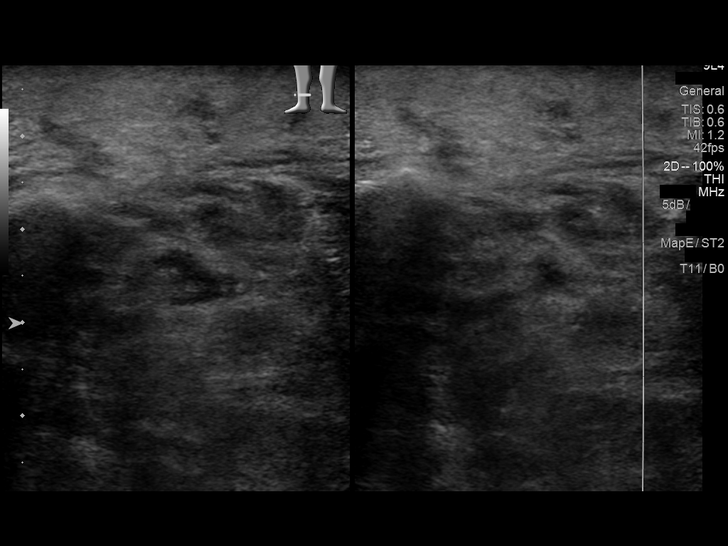
[im 19/36]
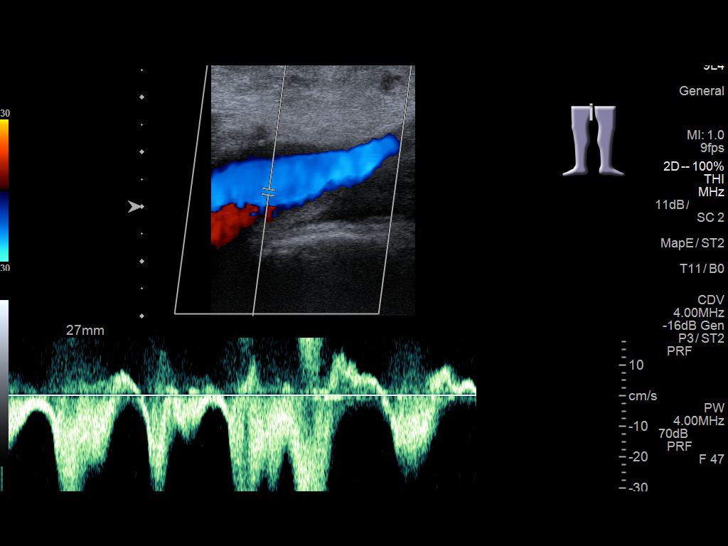
[im 20/36]
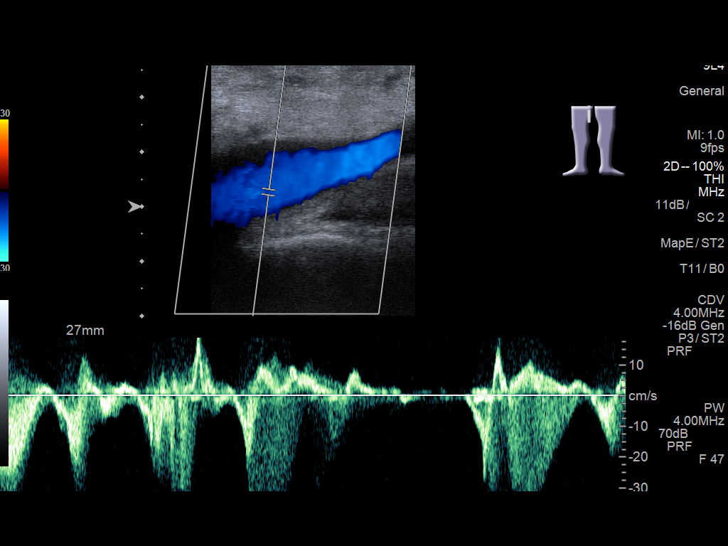
[im 23/36]
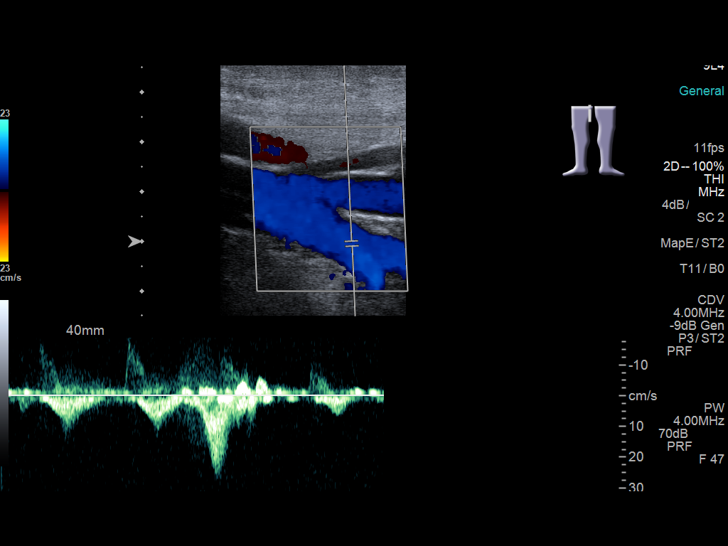
[im 26/36]
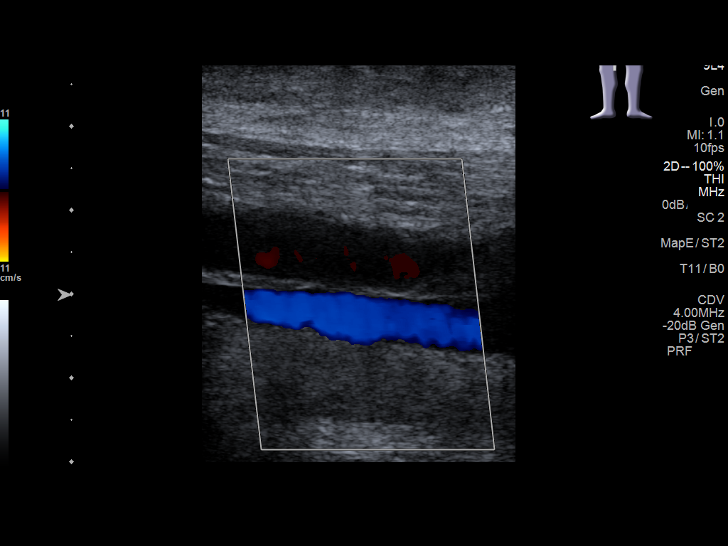
[im 29/36]
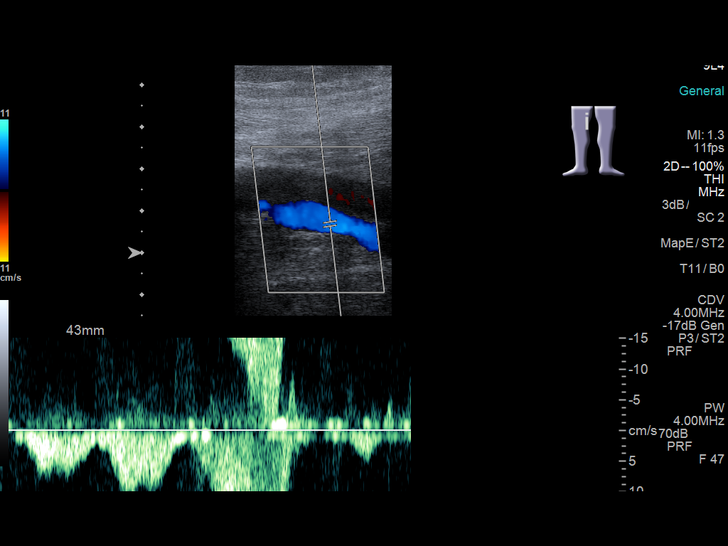
[im 32/36]
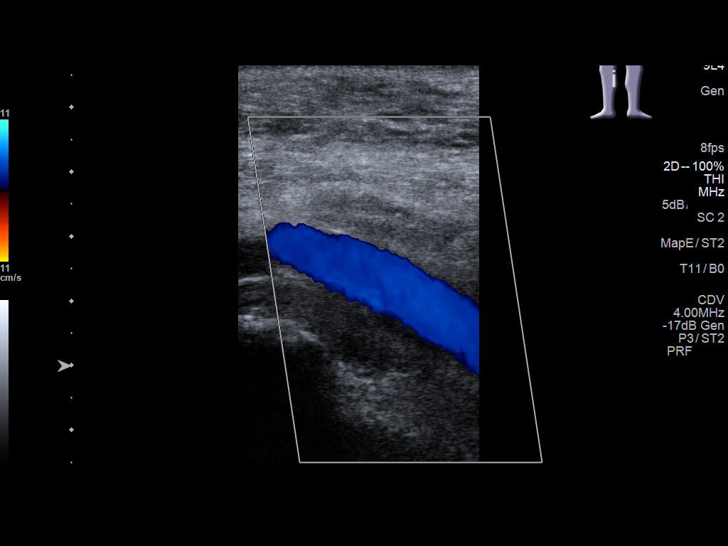
[im 36/36]
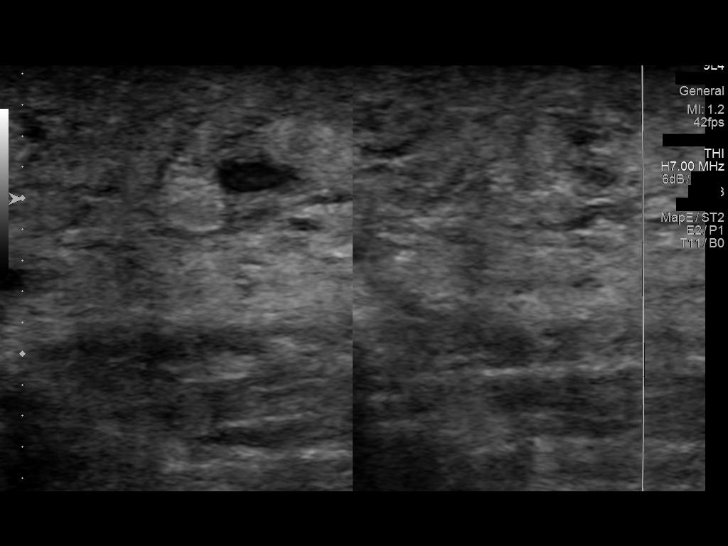

[13 of 24 positions shown; findings below may reference images not displayed]

FINDINGS: Contralateral Common Femoral Vein: Respiratory phasicity is normal
and symmetric with the symptomatic side. No evidence of thrombus.
Normal compressibility.

Common Femoral Vein: No evidence of thrombus. Normal
compressibility, respiratory phasicity and response to augmentation.

Saphenofemoral Junction: No evidence of thrombus. Normal
compressibility and flow on color Doppler imaging.

Profunda Femoral Vein: No evidence of thrombus. Normal
compressibility and flow on color Doppler imaging.

Femoral Vein: No evidence of thrombus. Normal compressibility,
respiratory phasicity and response to augmentation.

Popliteal Vein: No evidence of thrombus. Normal compressibility,
respiratory phasicity and response to augmentation.

Calf Veins: No evidence of thrombus. Normal compressibility and flow
on color Doppler imaging.

Superficial Great Saphenous Vein: No evidence of thrombus. Normal
compressibility and flow on color Doppler imaging.

Venous Reflux:  None.

Other Findings: Nonocclusive echogenic material in the distal right
greater saphenous vein, compatible nonocclusive superficial
thrombus.
IMPRESSION: 1. No evidence of right lower extremity deep venous thrombosis.
2. However, there does appear to be a small nonocclusive thrombus in
the distal right greater saphenous vein.

## 2018-09-27 ENCOUNTER — Encounter: Payer: Self-pay | Admitting: Nurse Practitioner

## 2018-09-27 ENCOUNTER — Other Ambulatory Visit: Payer: Self-pay

## 2018-09-27 DIAGNOSIS — Z1211 Encounter for screening for malignant neoplasm of colon: Secondary | ICD-10-CM

## 2018-10-10 ENCOUNTER — Encounter
Admission: RE | Disposition: A | Discharge status: Home / Self Care | Payer: Self-pay | Source: Ambulatory Visit | Attending: Gastroenterology

## 2018-10-10 ENCOUNTER — Ambulatory Visit: Payer: Medicare PPO | Admitting: Anesthesiology

## 2018-10-10 ENCOUNTER — Other Ambulatory Visit: Payer: Self-pay

## 2018-10-10 ENCOUNTER — Ambulatory Visit
Admission: RE | Admit: 2018-10-10 | Discharge: 2018-10-10 | Disposition: A | Discharge status: Home / Self Care | Payer: Medicare PPO | Source: Ambulatory Visit | Attending: Gastroenterology | Admitting: Gastroenterology

## 2018-10-10 DIAGNOSIS — Z1211 Encounter for screening for malignant neoplasm of colon: Secondary | ICD-10-CM | POA: Diagnosis not present

## 2018-10-10 DIAGNOSIS — J45909 Unspecified asthma, uncomplicated: Secondary | ICD-10-CM | POA: Diagnosis not present

## 2018-10-10 DIAGNOSIS — I10 Essential (primary) hypertension: Secondary | ICD-10-CM | POA: Insufficient documentation

## 2018-10-10 DIAGNOSIS — Z79899 Other long term (current) drug therapy: Secondary | ICD-10-CM | POA: Insufficient documentation

## 2018-10-10 DIAGNOSIS — F17210 Nicotine dependence, cigarettes, uncomplicated: Secondary | ICD-10-CM | POA: Diagnosis not present

## 2018-10-10 DIAGNOSIS — K648 Other hemorrhoids: Secondary | ICD-10-CM | POA: Diagnosis not present

## 2018-10-10 DIAGNOSIS — Z8371 Family history of colonic polyps: Secondary | ICD-10-CM | POA: Diagnosis not present

## 2018-10-10 DIAGNOSIS — R7303 Prediabetes: Secondary | ICD-10-CM | POA: Diagnosis not present

## 2018-10-10 HISTORY — PX: COLONOSCOPY WITH PROPOFOL: SHX5780

## 2018-10-10 HISTORY — DX: Unspecified asthma, uncomplicated: J45.909

## 2018-10-10 HISTORY — DX: Prediabetes: R73.03

## 2018-10-10 LAB — URINE DRUG SCREEN, QUALITATIVE (ARMC ONLY)
AMPHETAMINES, UR SCREEN: NOT DETECTED
Barbiturates, Ur Screen: NOT DETECTED
Benzodiazepine, Ur Scrn: NOT DETECTED
CANNABINOID 50 NG, UR ~~LOC~~: POSITIVE — AB
Cocaine Metabolite,Ur ~~LOC~~: NOT DETECTED
MDMA (ECSTASY) UR SCREEN: NOT DETECTED
Methadone Scn, Ur: NOT DETECTED
Opiate, Ur Screen: NOT DETECTED
Phencyclidine (PCP) Ur S: NOT DETECTED
TRICYCLIC, UR SCREEN: NOT DETECTED

## 2018-10-10 SURGERY — COLONOSCOPY WITH PROPOFOL
Anesthesia: General

## 2018-10-10 MED ORDER — SODIUM CHLORIDE 0.9 % IV SOLN
INTRAVENOUS | Status: DC
  Administered 2018-10-10: 10:00:00 via INTRAVENOUS

## 2018-10-10 MED ORDER — PROPOFOL 500 MG/50ML IV EMUL
INTRAVENOUS | Status: DC | PRN
  Administered 2018-10-10: 125 ug/kg/min via INTRAVENOUS

## 2018-10-10 MED ORDER — LIDOCAINE HCL (CARDIAC) PF 100 MG/5ML IV SOSY
PREFILLED_SYRINGE | INTRAVENOUS | Status: DC | PRN
  Administered 2018-10-10: 30 mg via INTRAVENOUS

## 2018-10-10 MED ORDER — PROPOFOL 500 MG/50ML IV EMUL
INTRAVENOUS | Status: AC
  Filled 2018-10-10: qty 50

## 2018-10-10 MED ORDER — PROPOFOL 10 MG/ML IV BOLUS
INTRAVENOUS | Status: DC | PRN
  Administered 2018-10-10 (×2): 50 mg via INTRAVENOUS

## 2018-10-10 NOTE — Transfer of Care (Signed)
Immediate Anesthesia Transfer of Care Note  Patient: Jonathan Garza  Procedure(s) Performed: COLONOSCOPY WITH PROPOFOL (N/A )  Patient Location: PACU and Endoscopy Unit  Anesthesia Type:General  Level of Consciousness: awake and alert   Airway & Oxygen Therapy: Patient Spontanous Breathing and Patient connected to nasal cannula oxygen  Post-op Assessment: Report given to RN and Post -op Vital signs reviewed and stable  Post vital signs: Reviewed and stable  Last Vitals:  Vitals Value Taken Time  BP 100/73 10/10/2018 11:33 AM  Temp    Pulse 59 10/10/2018 11:34 AM  Resp 21 10/10/2018 11:34 AM  SpO2 98 % 10/10/2018 11:34 AM  Vitals shown include unvalidated device data.  Last Pain:  Vitals:   10/10/18 1133  TempSrc: (P) Tympanic  PainSc:          Complications: No apparent anesthesia complications

## 2018-10-10 NOTE — Anesthesia Postprocedure Evaluation (Signed)
Anesthesia Post Note  Patient: Chief Operating Officer  Procedure(s) Performed: COLONOSCOPY WITH PROPOFOL (N/A )  Patient location during evaluation: Endoscopy Anesthesia Type: General Level of consciousness: awake and alert and oriented Pain management: pain level controlled Vital Signs Assessment: post-procedure vital signs reviewed and stable Respiratory status: spontaneous breathing, nonlabored ventilation and respiratory function stable Cardiovascular status: blood pressure returned to baseline and stable Postop Assessment: no signs of nausea or vomiting Anesthetic complications: no     Last Vitals:  Vitals:   10/10/18 1133 10/10/18 1149  BP: 100/73 127/90  Pulse:    Resp:    Temp: (!) 36.1 C   SpO2:      Last Pain:  Vitals:   10/10/18 1149  TempSrc:   PainSc: 0-No pain                 Celester Lech
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
## 2018-10-10 NOTE — Anesthesia Post-op Follow-up Note (Signed)
Anesthesia QCDR form completed.        

## 2018-10-10 NOTE — Op Note (Signed)
[facility]astroenterology Patient Name: Jonathan Garza Procedure Date: 10/10/2018 10:14 AM MRN: [id number] Account #: [id number] Date of Birth: [birth year] Admit Type: Outpatient Age: 53 Room: [facility] ENDO ROOM 4 Gender: Male Note Status: Finalized Procedure:            Colonoscopy Indications:          Colon cancer screening in patient at increased risk:                        Family history of 1st-degree relative with colon polyps Providers:            Jonathon Bellows MD, MD Medicines:            Monitored Anesthesia Care Complications:        No immediate complications. Procedure:            Pre-Anesthesia Assessment:                       - Prior to the procedure, a History and Physical was                        performed, and patient medications, allergies and                        sensitivities were reviewed. The patient's tolerance of                        previous anesthesia was reviewed.                       - The risks and benefits of the procedure and the                        sedation options and risks were discussed with the                        patient. All questions were answered and informed                        consent was obtained.                       - ASA Grade Assessment: II - A patient with mild                        systemic disease.                       After obtaining informed consent, the colonoscope was                        passed under direct vision. Throughout the procedure,                        the patient's blood pressure, pulse, and oxygen                        saturations were monitored continuously. The                        Colonoscope was introduced through the anus and  advanced to the the cecum, identified by the                        appendiceal orifice, IC valve and transillumination.                        The Colonoscope was introduced through the and advanced                        to  the. The colonoscopy was performed with ease. The                        patient tolerated the procedure well. The quality of                        the bowel preparation was good. Findings:      The perianal and digital rectal examinations were normal.      Non-bleeding internal hemorrhoids were found during retroflexion. The       hemorrhoids were medium-sized.      The exam was otherwise without abnormality on direct and retroflexion       views. Impression:           - Non-bleeding internal hemorrhoids.                       - The examination was otherwise normal on direct and                        retroflexion views.                       - No specimens collected. Recommendation:       - Discharge patient to home (with escort).                       - Resume previous diet.                       - Continue present medications.                       - Repeat colonoscopy in 5 years for screening purposes. Procedure Code(s):    --- Professional ---                       [phone], Colonoscopy, flexible; diagnostic, including                        collection of specimen(s) by brushing or washing, when                        performed (separate procedure) Diagnosis Code(s):    --- Professional ---                       Z83.71, Family history of colonic polyps                       K64.8, Other hemorrhoids CPT copyright 2018 [employer]. All rights reserved. The codes documented in this report are preliminary and upon coder review may  be revised to meet current compliance requirements. Jonathon Bellows, MD Jonathon Bellows  MD, MD 10/10/2018 11:30:29 AM This report has been signed electronically. Number of Addenda: 0 Note Initiated On: 10/10/2018 10:14 AM Scope Withdrawal Time: 0 hours 8 minutes 32 seconds  Total Procedure Duration: 0 hours 14 minutes 51 seconds       [facility]

## 2018-10-10 NOTE — Anesthesia Preprocedure Evaluation (Signed)
Anesthesia Evaluation  Patient identified by MRN, date of birth, ID band Patient awake    Reviewed: Allergy & Precautions, H&P , NPO status , Patient's Chart, lab work & pertinent test results, reviewed documented beta blocker date and time   History of Anesthesia Complications Negative for: history of anesthetic complications  Airway Mallampati: II  TM Distance: >3 FB Neck ROM: full    Dental  (+) Dental Advidsory Given, Missing, Poor Dentition   Pulmonary neg shortness of breath, asthma , neg sleep apnea, neg COPD, neg recent URI, Current Smoker,           Cardiovascular Exercise Tolerance: Good hypertension, (-) angina(-) CAD, (-) Past MI, (-) Cardiac Stents and (-) CABG (-) dysrhythmias (-) Valvular Problems/Murmurs     Neuro/Psych negative neurological ROS  negative psych ROS   GI/Hepatic negative GI ROS, Neg liver ROS,   Endo/Other  diabetes (Pre-diabetic)  Renal/GU negative Renal ROS  negative genitourinary   Musculoskeletal   Abdominal   Peds  Hematology negative hematology ROS (+)   Anesthesia Other Findings Past Medical History: No date: Asthma     Comment:  triggered by pollen No date: Essential hypertension No date: Pre-diabetes No date: Psoriasis   Reproductive/Obstetrics negative OB ROS                             Anesthesia Physical Anesthesia Plan  ASA: II  Anesthesia Plan: General   Post-op Pain Management:    Induction: Intravenous  PONV Risk Score and Plan: 1 and Propofol infusion and TIVA  Airway Management Planned: Nasal Cannula and Natural Airway  Additional Equipment:   Intra-op Plan:   Post-operative Plan:   Informed Consent: I have reviewed the patients History and Physical, chart, labs and discussed the procedure including the risks, benefits and alternatives for the proposed anesthesia with the patient or authorized representative who has  indicated his/her understanding and acceptance.     Dental Advisory Given  Plan Discussed with: Anesthesiologist, CRNA and Surgeon  Anesthesia Plan Comments:         Anesthesia Quick Evaluation

## 2018-10-12 ENCOUNTER — Encounter: Payer: Self-pay | Admitting: Gastroenterology

## 2019-05-13 ENCOUNTER — Other Ambulatory Visit: Payer: Self-pay

## 2019-05-13 ENCOUNTER — Encounter: Payer: Self-pay | Admitting: Emergency Medicine

## 2019-05-13 ENCOUNTER — Ambulatory Visit
Admission: EM | Admit: 2019-05-13 | Discharge: 2019-05-13 | Disposition: A | Discharge status: Home / Self Care | Payer: Medicare PPO | Attending: Family | Admitting: Family

## 2019-05-13 DIAGNOSIS — M25551 Pain in right hip: Secondary | ICD-10-CM | POA: Diagnosis not present

## 2019-05-13 MED ORDER — MELOXICAM 15 MG PO TABS: 15.0000 mg | ORAL_TABLET | Freq: Every day | ORAL | 0 refills | Status: DC | PRN

## 2019-05-13 MED ORDER — KETOROLAC TROMETHAMINE 60 MG/2ML IM SOLN
60.0000 mg | Freq: Once | INTRAMUSCULAR | Status: AC
  Administered 2019-05-13: 60 mg via INTRAMUSCULAR
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
## 2019-05-13 NOTE — ED Triage Notes (Signed)
Patient c/o right hip pain and pain that goes down his right leg that started on Thursday.  Patient denies injury or fall.

## 2019-05-13 NOTE — Discharge Instructions (Signed)
You were given a shot of Toradol 60mg  today to help with pain and swelling. Recommend start Mobic 15mg  tablet later today to help with pain. May continue to apply warm compresses to area for comfort. Recommend follow-up here in 4 to 5 days if not improving.

## 2019-06-28 ENCOUNTER — Ambulatory Visit (INDEPENDENT_AMBULATORY_CARE_PROVIDER_SITE_OTHER): Payer: Medicare PPO | Admitting: Urology

## 2019-06-28 ENCOUNTER — Encounter: Payer: Self-pay | Admitting: Urology

## 2019-06-28 ENCOUNTER — Other Ambulatory Visit: Payer: Self-pay

## 2019-06-28 VITALS — BP 108/76 | HR 66 | Ht 65.0 in | Wt 162.0 lb

## 2019-06-28 DIAGNOSIS — R972 Elevated prostate specific antigen [PSA]: Secondary | ICD-10-CM
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
## 2019-06-28 NOTE — Patient Instructions (Addendum)
Prostate Cancer Screening  The prostate is a walnut-sized gland that is located below the bladder and in front of the rectum in males. The function of the prostate (prostate gland) is to add fluid to semen during ejaculation. Prostate cancer is the second most common type of cancer in men. A screening test for cancer is a test that is done before cancer symptoms start. Screening can help to identify cancer at an early stage, when the cancer can be treated more easily. The recommended prostate cancer screening test is a blood test called the prostate-specific antigen (PSA) test. PSA is a protein that is made in the prostate. As you age, your prostate naturally produces more PSA. Abnormally high PSA levels may be caused by:  Prostate cancer.  An enlarged prostate that is not caused by cancer (benign prostatic hyperplasia, BPH). This condition is very common in older men.  A prostate gland infection (prostatitis).  Medicines to assist with hair growth, such as finasteride. Depending on the PSA results, you may need more tests, such as:  A physical exam to check the size of your prostate gland.  Blood and imaging tests.  A procedure to remove tissue samples from your prostate gland for testing (biopsy). Who should have screening? Screening recommendations vary based on age.  If you are younger than age 40, screening is not recommended.  If you are age 40-54 and you have no risk factors, screening is not recommended.  If you are younger than age 55, ask your health care provider if you need screening if you have one of these risk factors: ? Being of African-American descent. ? Having a family history of prostate cancer.  If you are age 55-69, talk with your health care provider about your need for screening and how often screening should be done.  If you are older than age 70, screening is not recommended. This is because the risks that screening can cause are greater than the benefits  that it may provide (risks outweigh the benefits). If you are at high risk for prostate cancer, your health care provider may recommend that you have screenings more often or start screening at a younger age. You may be at high risk if you:  Are older than age 55.  Are African-American.  Have a father, brother, or uncle who has been diagnosed with prostate cancer. The risk may be higher if your family member's cancer occurred at an early age. What are the benefits of screening? There is a small chance that screening may lower your risk of dying from prostate cancer. The chance is small because prostate cancer is typically a slow-growing cancer, and most men with prostate cancer die from a different cause. What are the risks of screening? The main risk of prostate cancer screening is diagnosing and treating prostate cancer that would never have caused any symptoms or problems (overdiagnosis and overtreatment). PSA screening cannot tell you if your PSA is high due to cancer or a different cause. A prostate biopsy is the only procedure to diagnose prostate cancer. Even the results of a biopsy may not tell you if your cancer needs to be treated. Slow-growing prostate cancer may not need any treatment other than monitoring, so diagnosing and treating it may cause unnecessary stress or other side effects. A prostate biopsy may also cause:  Infection or fever.  A false negative. This is a result that shows that you do not have prostate cancer when you actually do have prostate cancer. Questions   to ask your health care provider  When should I start prostate cancer screening?  What is my risk for prostate cancer?  How often do I need screening?  What type of screening tests do I need?  How do I get my test results?  What do my results mean?  Do I need treatment? Contact a health care provider if:  You have difficulty urinating.  You have pain when you urinate or ejaculate.  You have  blood in your urine or semen.  You have pain in your back or in the area of your prostate.  You have trouble getting or maintaining an erection (erectile dysfunction, ED). Summary  Prostate cancer is a common type of cancer in men. The prostate (prostate gland) is located below the bladder and in front of the rectum. This gland adds fluid to semen during ejaculation.  Prostate cancer screening may identify cancer at an early stage, when the cancer can be treated more easily.  The prostate-specific antigen (PSA) test is the recommended screening test for prostate cancer.  Discuss the risks and benefits of prostate cancer screening with your health care provider. If you are age 53 or older, screening is likely to lead to more risks than benefits (risks outweigh the benefits). This information is not intended to replace advice given to you by your health care provider. Make sure you discuss any questions you have with your health care provider. Document Released: 05/28/2017 Document Revised: 07/30/2017 Document Reviewed: 05/28/2017 Elsevier Patient Education  2020 [employer].    Prostate Biopsy Instructions  Stop all aspirin or blood thinners (aspirin, plavix, coumadin, warfarin, motrin, ibuprofen, advil, aleve, naproxen, naprosyn) for 7 days prior to the procedure.  If you have any questions about stopping these medications, please contact your primary care physician or cardiologist.  Having a light meal prior to the procedure is recommended.  If you are diabetic or have low blood sugar please bring a small snack or glucose tablet.  A Fleets enema is needed to be purchased over the counter at a local pharmacy and used 2 hours before you scheduled appointment.  This can be purchased over the counter at any pharmacy.  Antibiotics will be administered in the clinic at the time of the procedure unless otherwise specified.    Please bring someone with you to the procedure to drive you home.   A follow up appointment has been scheduled for you to receive the results of the biopsy.  If you have any questions or concerns, please feel free to call the office at [phone] or send a Mychart message.    Thank you, Staff at [facility]

## 2019-06-29 ENCOUNTER — Telehealth: Payer: Self-pay

## 2019-06-29 ENCOUNTER — Other Ambulatory Visit: Payer: Self-pay | Admitting: Urology

## 2019-06-29 LAB — PSA TOTAL (REFLEX TO FREE): Prostate Specific Ag, Serum: 6.9 ng/mL — ABNORMAL HIGH (ref 0.0–4.0)

## 2019-06-29 LAB — FPSA% REFLEX
% FREE PSA: 7.5 %
PSA, FREE: 0.52 ng/mL

## 2019-06-29 MED ORDER — DIAZEPAM 5 MG PO TABS: 5.0000 mg | ORAL_TABLET | Freq: Once | ORAL | 0 refills | Status: DC | PRN

## 2019-06-29 NOTE — Telephone Encounter (Signed)
-----   Message from Billey Co, MD sent at 06/29/2019  7:53 AM EDT ----- PSA remains elevated, need to schedule prostate biopsy as discussed in clinic.  Please schedule biopsy and review instructions, I will send a valium to his pharmacy, thanks  Nickolas Madrid, MD 06/29/2019

## 2019-06-29 NOTE — Telephone Encounter (Signed)
Patient notified and instructions were discussed in detail. Patient was instructed to stop Mobic 7days prior, he verbalized understanding . Please schedule and contact patient with date and time

## 2019-07-05 ENCOUNTER — Ambulatory Visit
Admission: EM | Admit: 2019-07-05 | Discharge: 2019-07-05 | Disposition: A | Discharge status: Home / Self Care | Payer: Medicare PPO | Attending: Family Medicine | Admitting: Family Medicine

## 2019-07-05 ENCOUNTER — Other Ambulatory Visit: Payer: Self-pay

## 2019-07-05 ENCOUNTER — Encounter: Payer: Self-pay | Admitting: Emergency Medicine

## 2019-07-05 DIAGNOSIS — M25552 Pain in left hip: Secondary | ICD-10-CM | POA: Diagnosis not present

## 2019-07-05 MED ORDER — KETOROLAC TROMETHAMINE 60 MG/2ML IM SOLN
60.0000 mg | Freq: Once | INTRAMUSCULAR | Status: AC
  Administered 2019-07-05: 14:00:00 60 mg via INTRAMUSCULAR

## 2019-07-05 MED ORDER — MELOXICAM 15 MG PO TABS: 15.0000 mg | ORAL_TABLET | Freq: Every day | ORAL | 0 refills | Status: DC | PRN

## 2019-07-05 NOTE — ED Triage Notes (Signed)
Patient c/o left hip pain that started 2 weeks ago. Patient denies injury or fall.

## 2019-07-13 ENCOUNTER — Encounter: Payer: Self-pay | Admitting: Urology

## 2019-07-13 ENCOUNTER — Ambulatory Visit (INDEPENDENT_AMBULATORY_CARE_PROVIDER_SITE_OTHER): Payer: Medicare PPO | Admitting: Urology

## 2019-07-13 ENCOUNTER — Other Ambulatory Visit: Payer: Self-pay

## 2019-07-13 VITALS — BP 142/88 | HR 82 | Ht 65.0 in | Wt 172.0 lb

## 2019-07-13 DIAGNOSIS — R972 Elevated prostate specific antigen [PSA]: Secondary | ICD-10-CM

## 2019-07-13 MED ORDER — SULFAMETHOXAZOLE-TRIMETHOPRIM 800-160 MG PO TABS: 1.0000 | ORAL_TABLET | Freq: Two times a day (BID) | ORAL | 0 refills | Status: DC

## 2019-07-13 MED ORDER — GENTAMICIN SULFATE 40 MG/ML IJ SOLN
80.0000 mg | Freq: Once | INTRAMUSCULAR | Status: AC
  Administered 2019-07-13: 80 mg via INTRAMUSCULAR

## 2019-07-13 MED ORDER — LEVOFLOXACIN 500 MG PO TABS
500.0000 mg | ORAL_TABLET | Freq: Once | ORAL | Status: AC
  Administered 2019-07-13: 500 mg via ORAL

## 2019-07-13 NOTE — Patient Instructions (Signed)
Magnetic Resonance Cholangiopancreatogram Magnetic resonance imaging (MRI) is a painless test that produces images of the inside of the body without using X-rays. During an MRI, strong magnets and radio waves work together in a magnetic field to form detailed images. MRI images may provide more details about a medical condition than X-rays, CT scans, and ultrasounds can provide. Magnetic resonance cholangiopancreatogram (MRCP) is an MRI that is done on your gallbladder, bile duct, pancreas, and pancreatic duct. This test can be used to help diagnose problems such as tumors, stones, infection, or inflammation. It is sometimes used to help determine the cause of pancreatitis or abdominal pain. In some cases, dye (contrast material or contrast dye) may be injected into your bloodstream to make the MRI images even clearer. Tell a health care provider about:  Any surgeries you have had.  Any metal you may have in your body. The magnet used in MRCP can cause metal objects in your body to move. Metal can also make it hard to get high-quality images. Objects that may contain metal include: ? Any joint replacement (prosthesis), such as an artificial knee or hip. ? An implanted defibrillator, pacemaker, or neurostimulator. ? A metallic ear implant (cochlear implant). ? An artificial heart valve. ? A metallic object in the eye socket. ? Metal splinters. ? Bullet fragments. ? A port for delivering insulin or chemotherapy.  Any tattoos. Some red dyes contain iron, which can cause problems with testing.  Whether you are pregnant, may be pregnant, or are breastfeeding.  Any fear of cramped spaces (claustrophobia). If this is a problem, it usually can be relieved with medicines.  Any allergies you have.  Medical conditions you have, including kidney disease.  All medicines you are taking, including vitamins, herbs, eye drops, creams, and over-the-counter medicines. What are the risks? Generally, this is  a safe test. However, problems can occur:  If you have metal in your body, it may be affected by the magnet used during the test. If you have a metallic implant close to the area being tested, it may be hard to get high-quality images.  Allergic reaction to the contrast dye is possible.  If you are pregnant, you should avoid MRI tests, including MRCP, during the first three months of pregnancy. MRI may have effects on an unborn baby.  If you are breastfeeding and contrast material will be used during your test, you may need to stop breastfeeding temporarily. Your breast milk may contain contrast material until the material naturally leaves your body. What happens before the procedure?  You will be asked to remove all metal, including: ? Your watch, jewelry, and other metal objects. ? Hearing aids. ? Dentures. ? Underwire bra. ? Makeup. Certain kinds of makeup contain small amounts of metal. ? Braces and fillings normally are not a problem.  If you are breastfeeding, ask your health care provider if you need to pump before your test and stop breastfeeding temporarily. You may need to do this if contrast material will be used. What happens during the procedure?   You may be given earplugs or headphones to listen to music. The machine can be noisy.  If a contrast material will be used, an IV will be inserted into one of your veins. Contrast material will be injected into your IV.  You will lie down on a platform, similar to a long table.  The platform will slide into a tunnel that has magnets inside of it. When you are inside the tunnel, you will   still be able to talk to your health care provider.  You will be asked to lie very still while images are taken. Your health care provider will tell you when you can move. You may have to wait a few minutes to make sure that the images produced during the test are readable.  When all images are produced, the platform will slide out of the  tunnel. The procedure may vary among health care providers and hospitals. What happens after the procedure?  You may return to your normal activities right away, or as told by your health care provider.  If contrast material was used: ? It will leave your body through your urine within a day. You may be told to drink plenty of fluids to help flush the contrast material out of your system. ? If you are breastfeeding, do not breastfeed your child until your health care provider says that this is safe.  It is up to you to get your test results. Ask your health care provider, or the department that is doing the test, when your results will be ready. Summary  Magnetic resonance imaging (MRI) is a painless test that produces detailed pictures of the inside of your body without using X-rays. Instead, strong magnets and radio waves work together in a magnetic field to form very detailed and sharp images.  Magnetic resonance cholangiopancreatogram (MRCP) is an MRI that is done on your gallbladder, bile duct, pancreas, and pancreatic duct. MRCP produces detailed images of these organs.  MRCP can be used to help diagnose tumors, stones, infection, or inflammation. It is sometimes used to help determine the cause of pancreatitis or abdominal pain.  Before your test, be sure to tell your health care provider about any metal you may have in your body.  Talk with your health care provider about what your test results mean. This information is not intended to replace advice given to you by your health care provider. Make sure you discuss any questions you have with your health care provider. Document Released: 02/03/2008 Document Revised: 07/30/2017 Document Reviewed: 07/12/2017 Elsevier Patient Education  2020 Elsevier Inc.  

## 2019-07-13 NOTE — Progress Notes (Signed)
   07/13/2019 1:07 PM   [name] 19-May-1966 [id number]  [name] was in urology clinic today for scheduled prostate biopsy for an elevated PSA of 6.9 with 7.5% free.  The patient was unable to tolerate the transrectal ultrasound in the rectum, and refused the procedure.  We discussed options including repeat PSA in 2 months, or prostate MRI.  I strongly recommended prostate MRI with his elevated PSA and concerning low percentage free.  We discussed the need for biopsy in the operating room if any suspicious lesions on MRI.  Prostate MRI, follow-up to discuss results If any abnormal lesions, will require biopsy in the operating room  A total of 15 minutes were spent face-to-face with the patient, greater than 50% was spent in patient education, counseling, and coordination of care regarding prostate cancer screening options.  Billey Co, [facility] [address] [place], [place] [address] [phone]
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
## 2019-07-31 ENCOUNTER — Ambulatory Visit: Payer: Medicare PPO

## 2019-08-03 ENCOUNTER — Ambulatory Visit: Payer: Medicare PPO | Admitting: Urology

## 2019-08-15 ENCOUNTER — Other Ambulatory Visit: Payer: Self-pay

## 2019-08-15 ENCOUNTER — Ambulatory Visit
Admission: RE | Admit: 2019-08-15 | Discharge: 2019-08-15 | Disposition: A | Discharge status: Home / Self Care | Payer: Medicare PPO | Source: Ambulatory Visit | Attending: Urology | Admitting: Urology

## 2019-08-15 DIAGNOSIS — R972 Elevated prostate specific antigen [PSA]: Secondary | ICD-10-CM | POA: Diagnosis present

## 2019-08-15 LAB — POCT I-STAT CREATININE: Creatinine, Ser: 0.9 mg/dL (ref 0.61–1.24)

## 2019-08-15 MED ORDER — GADOBUTROL 1 MMOL/ML IV SOLN
7.0000 mL | Freq: Once | INTRAVENOUS | Status: AC | PRN
  Administered 2019-08-15: 7 mL via INTRAVENOUS

## 2019-09-18 ENCOUNTER — Ambulatory Visit: Payer: Medicare PPO | Admitting: Urology

## 2019-09-19 ENCOUNTER — Ambulatory Visit (INDEPENDENT_AMBULATORY_CARE_PROVIDER_SITE_OTHER): Payer: Medicare Other | Admitting: Urology

## 2019-09-19 ENCOUNTER — Encounter: Payer: Self-pay | Admitting: Urology

## 2019-09-19 ENCOUNTER — Other Ambulatory Visit: Payer: Self-pay

## 2019-09-19 VITALS — BP 138/97 | HR 60 | Ht 65.0 in | Wt 162.0 lb

## 2019-09-19 DIAGNOSIS — N492 Inflammatory disorders of scrotum: Secondary | ICD-10-CM | POA: Diagnosis not present

## 2019-09-19 DIAGNOSIS — R972 Elevated prostate specific antigen [PSA]: Secondary | ICD-10-CM | POA: Diagnosis not present

## 2019-09-19 MED ORDER — SULFAMETHOXAZOLE-TRIMETHOPRIM 800-160 MG PO TABS: 1.0000 | ORAL_TABLET | Freq: Two times a day (BID) | ORAL | 0 refills | Status: DC

## 2019-09-19 NOTE — Patient Instructions (Signed)
Skin Abscess  A skin abscess is an infected area on or under your skin that contains a collection of pus and other material. An abscess may also be called a furuncle, carbuncle, or boil. An abscess can occur in or on almost any part of your body. Some abscesses break open (rupture) on their own. Most continue to get worse unless they are treated. The infection can spread deeper into the body and eventually into your blood, which can make you feel ill. Treatment usually involves draining the abscess. What are the causes? An abscess occurs when germs, like bacteria, pass through your skin and cause an infection. This may be caused by:  A scrape or cut on your skin.  A puncture wound through your skin, including a needle injection or insect bite.  Blocked oil or sweat glands.  Blocked and infected hair follicles.  A cyst that forms beneath your skin (sebaceous cyst) and becomes infected. What increases the risk? This condition is more likely to develop in people who:  Have a weak body defense system (immune system).  Have diabetes.  Have dry and irritated skin.  Get frequent injections or use illegal IV drugs.  Have a foreign body in a wound, such as a splinter.  Have problems with their lymph system or veins. What are the signs or symptoms? Symptoms of this condition include:  A painful, firm bump under the skin.  A bump with pus at the top. This may break through the skin and drain. Other symptoms include:  Redness surrounding the abscess site.  Warmth.  Swelling of the lymph nodes (glands) near the abscess.  Tenderness.  A sore on the skin. How is this diagnosed? This condition may be diagnosed based on:  A physical exam.  Your medical history.  A sample of pus. This may be used to find out what is causing the infection.  Blood tests.  Imaging tests, such as an ultrasound, CT scan, or MRI. How is this treated? A small abscess that drains on its own may  not need treatment. Treatment for larger abscesses may include:  Moist heat or heat pack applied to the area several times a day.  A procedure to drain the abscess (incision and drainage).  Antibiotic medicines. For a severe abscess, you may first get antibiotics through an IV and then change to antibiotics by mouth. Follow these instructions at home: Medicines   Take over-the-counter and prescription medicines only as told by your health care provider.  If you were prescribed an antibiotic medicine, take it as told by your health care provider. Do not stop taking the antibiotic even if you start to feel better. Abscess care   If you have an abscess that has not drained, apply heat to the affected area. Use the heat source that your health care provider recommends, such as a moist heat pack or a heating pad. ? Place a towel between your skin and the heat source. ? Leave the heat on for 20-30 minutes. ? Remove the heat if your skin turns bright red. This is especially important if you are unable to feel pain, heat, or cold. You may have a greater risk of getting burned.  Follow instructions from your health care provider about how to take care of your abscess. Make sure you: ? Cover the abscess with a bandage (dressing). ? Change your dressing or gauze as told by your health care provider. ? Wash your hands with soap and water before you change the   dressing or gauze. If soap and water are not available, use hand sanitizer.  Check your abscess every day for signs of a worsening infection. Check for: ? More redness, swelling, or pain. ? More fluid or blood. ? Warmth. ? More pus or a bad smell. General instructions  To avoid spreading the infection: ? Do not share personal care items, towels, or hot tubs with others. ? Avoid making skin contact with other people.  Keep all follow-up visits as told by your health care provider. This is important. Contact a health care provider if  you have:  More redness, swelling, or pain around your abscess.  More fluid or blood coming from your abscess.  Warm skin around your abscess.  More pus or a bad smell coming from your abscess.  A fever.  Muscle aches.  Chills or a general ill feeling. Get help right away if you:  Have severe pain.  See red streaks on your skin spreading away from the abscess. Summary  A skin abscess is an infected area on or under your skin that contains a collection of pus and other material.  A small abscess that drains on its own may not need treatment.  Treatment for larger abscesses may include having a procedure to drain the abscess and taking an antibiotic. This information is not intended to replace advice given to you by your health care provider. Make sure you discuss any questions you have with your health care provider. Document Revised: 12/08/2018 Document Reviewed: 09/30/2017 Elsevier Patient Education  2020 [employer]  

## 2019-09-19 NOTE — Progress Notes (Signed)
   09/19/2019 11:34 AM   [name] 1966/06/19 [id number]  Reason for visit: Elevated PSA, discuss prostate MRI results  HPI: I saw [name] in follow-up for elevated PSA and to discuss his prostate results.  Briefly, he is a 54 year old African-American male with a family history of lethal prostate cancer in his father and uncle who was found to have an elevated PSA of 6.9 with 7.5% free.  We attempted prostate biopsy in clinic on 07/13/2019 with Valium, however patient was unable to tolerate prostate biopsy.  A prostate MRI was ordered to evaluate for any suspicious lesions.  MRI showed PI-RADS 4 lesions in the right and left peripheral zone of the prostate worrisome for possible malignancy.  There was some indistinct appearance of the left prostate margin near the neurovascular bundle, however no frank extraprostatic extension or adenopathy.  He also reports a new right-sided scrotal lesion with pain and tenderness.  On exam he has a small 2 cm boil of the right scrotal skin that appears superficial in nature.  He reportedly had this drained many years ago at the hospital with improvement in his symptoms.  He denies any fevers or chills, and the lesion has been present for at least 4 to 6 weeks.  He opted for incision and drainage in clinic today.   Procedure: Scrotal abscess I&D The patient was prepped and draped in standard sterile fashion.  The scrotum was cleaned extensively with Betadine.  A small stab incision was made with an 11 blade scalpel and 5 cc of purulent material were drained from the abscess.  A sterile Q-tip was used to break up loculations.  The wound was dressed with sterile gauze and mesh pants were applied.  We discussed the need at length for further evaluation of his elevated PSA with prostate biopsy, especially in the setting of his concerning MRI findings.  We reviewed the implications of an elevated PSA and the uncertainty surrounding it. In general, a man's PSA  increases with age and is produced by both normal and cancerous prostate tissue. The differential diagnosis for elevated PSA includes BPH, prostate cancer, infection, recent intercourse/ejaculation, recent urethroscopic manipulation (foley placement/cystoscopy) or trauma, and prostatitis.   Management of an elevated PSA can include observation or prostate biopsy and we discussed this in detail. Our goal is to detect clinically significant prostate cancers, and manage with either active surveillance, surgery, or radiation for localized disease. Risks of prostate biopsy include bleeding, infection (including life threatening sepsis), pain, and lower urinary symptoms. Hematuria, hematospermia, and blood in the stool are all common after biopsy and can persist up to 4 weeks.   Schedule prostate biopsy in OR Bactrim DS x10 days for scrotal skin abscess Return precautions discussed   A total of 30 minutes were spent face-to-face with the patient, greater than 50% was spent in patient education, counseling, and coordination of care regarding elevated PSA, MRI findings, need for prostate biopsy, and scrotal abscess.  Billey Co, [facility] [address], [place], [place] [address] [phone]

## 2019-09-26 ENCOUNTER — Other Ambulatory Visit: Payer: Self-pay | Admitting: Urology

## 2019-09-26 DIAGNOSIS — R972 Elevated prostate specific antigen [PSA]: Secondary | ICD-10-CM

## 2019-09-27 ENCOUNTER — Other Ambulatory Visit: Payer: Self-pay | Admitting: Urology

## 2019-09-27 DIAGNOSIS — R972 Elevated prostate specific antigen [PSA]: Secondary | ICD-10-CM

## 2019-10-05 ENCOUNTER — Ambulatory Visit: Admission: RE | Admit: 2019-10-05 | Payer: Medicare Other | Source: Ambulatory Visit

## 2019-10-06 ENCOUNTER — Encounter
Admission: RE | Admit: 2019-10-06 | Discharge: 2019-10-06 | Disposition: A | Discharge status: Home / Self Care | Payer: Medicare Other | Source: Ambulatory Visit | Attending: Urology | Admitting: Urology

## 2019-10-06 ENCOUNTER — Other Ambulatory Visit: Payer: Self-pay

## 2019-10-06 NOTE — Pre-Procedure Instructions (Signed)
Patient states that he currently has an active boil around his rectum.  It is not draining at present but is very uncomfortable. He expressed concern over the upcoming transrectal ultrasound.

## 2019-10-06 NOTE — Patient Instructions (Addendum)
INSTRUCTIONS FOR SURGERY     Your surgery is scheduled for:   Thursday, October 12, 2019     To find out your arrival time for the day of surgery,          please call [phone] between 1 pm and 3 pm on :  Wednesday, October 11, 2019     When you arrive for surgery, report to the [facility].       Do NOT stop on the first floor to register.    REMEMBER: Instructions that are not followed completely may result in serious medical risk,  up to and including death, or upon the discretion of your surgeon and anesthesiologist,            your surgery may need to be rescheduled.  __X__ 1. Do not eat food after midnight the night before your procedure.                    No gum, candy, lozenger, tic tacs, tums or hard candies.                  ABSOLUTELY NOTHING SOLID IN YOUR MOUTH AFTER MIDNIGHT                    You may drink unlimited clear liquids up to 2 hours before you are scheduled to arrive for surgery.                   Do not drink anything within those 2 hours unless you need to take medicine, then take the                   smallest amount you need.  Clear liquids include:  water, apple juice without pulp,                   any flavor Gatorade, Black coffee, black tea.  Sugar may be added but no dairy/ honey /lemon.                        Broth and jello is not considered a clear liquid.  __x__  2. On the morning of surgery, please brush your teeth with toothpaste and water. You may rinse with                  mouthwash if you wish but DO NOT SWALLOW TOOTHPASTE OR MOUTHWASH  __X___3. NO alcohol for 24 hours before or after surgery.  __x___ 4.  Do NOT smoke or use e-cigarettes for 24 HOURS PRIOR TO SURGERY.                      DO NOT Use any chewable tobacco products for at least 6 hours prior to surgery.  __x___ 5. If you start any new medication after this appointment and prior to surgery,  please                   Bring it with you on the day of surgery.  ___x__ 6. Notify your doctor if there is any  change in your medical condition, such as fever, infection, vomitting,                   Diarrhea or any open sores.  __x___ 7.  USE ANTIBACTERIAL SOAP as instructed, the night before surgery and the day of surgery.                   Once you have washed with this soap, do NOT use any of the following: Powders, perfumes                    or lotions. Please do not wear make up, hairpins, clips or nail polish. You MAY wear deodorant.                   Men may shave their face and neck.  Women need to shave 48 hours prior to surgery.                   DO NOT wear ANY jewelry on the day of surgery. If there are rings that are too tight to                    remove easily, please address this prior to the surgery day. Piercings need to be removed.                                                                     NO METAL ON YOUR BODY.                    Do NOT bring any valuables.  If you came to Pre-Admit testing then you will not need license,                     insurance card or credit card.  If you will be staying overnight, please either leave your things in                     the car or have your family be responsible for these items.                     [facility] IS NOT RESPONSIBLE FOR BELONGINGS OR VALUABLES.  ___X__ 8. DO NOT wear contact lenses on surgery day.  You may not have dentures,                     Hearing aides, contacts or glasses in the operating room. These items can be                    Placed in the Recovery Room to receive immediately after surgery.  __x___ 9. IF YOU ARE SCHEDULED TO GO HOME ON THE SAME DAY, YOU MUST                   Have someone to drive you home and to stay with you  for the first 24 hours.                    Have an arrangement prior to arriving on surgery day.  ___x__ 10. Take the following medications  on the morning of surgery  with a sip of water:                              1.                      2.                     3.                     4.                     5.                     6.  _____ 11.  Follow any instructions provided to you by your surgeon.                        Such as enema, clear liquid bowel prep  __X__  12. STOP ASPIRIN AS OF:  February 5TH                       THIS INCLUDES BC POWDERS / GOODIES POWDER  __x___ 13. STOP Anti-inflammatories as of:  February 5TH                      This includes IBUPROFEN / MOTRIN / ADVIL / ALEVE/ NAPROXYN                    YOU MAY TAKE TYLENOL ANY TIME PRIOR TO SURGERY.    __X____14.  You may continue taking Vitamin B12 / Vitamin D3 but do not take on the morning of surgery.  ___X___17.  Continue to take the following medications but do not take on the morning of surgery:                        HYDROCHLOROTHIAZIDE // VITAMIN D  ______18. If staying overnight, please have appropriate shoes to wear to be able to walk around the unit.                   Wear clean and comfortable clothing to the hospital.  I have included paperwork for Medical Advance Directives.  If you decide to complete the papers, please have them  notarized and bring with you to the hospital so we can make a copy for your chart. If you do not complete this prior   To arriving for your procedure, then keep in a safe place.

## 2019-10-10 ENCOUNTER — Encounter
Admission: RE | Admit: 2019-10-10 | Discharge: 2019-10-10 | Disposition: A | Discharge status: Home / Self Care | Payer: Medicare Other | Source: Ambulatory Visit | Attending: Urology | Admitting: Urology

## 2019-10-10 ENCOUNTER — Other Ambulatory Visit: Payer: Self-pay

## 2019-10-10 DIAGNOSIS — Z01810 Encounter for preprocedural cardiovascular examination: Secondary | ICD-10-CM | POA: Diagnosis present

## 2019-10-10 DIAGNOSIS — I10 Essential (primary) hypertension: Secondary | ICD-10-CM | POA: Diagnosis not present

## 2019-10-10 DIAGNOSIS — Z01812 Encounter for preprocedural laboratory examination: Secondary | ICD-10-CM | POA: Insufficient documentation

## 2019-10-10 DIAGNOSIS — Z20822 Contact with and (suspected) exposure to covid-19: Secondary | ICD-10-CM | POA: Insufficient documentation

## 2019-10-10 LAB — BASIC METABOLIC PANEL
Anion gap: 13 (ref 5–15)
BUN: 19 mg/dL (ref 6–20)
CO2: 28 mmol/L (ref 22–32)
Calcium: 9.3 mg/dL (ref 8.9–10.3)
Chloride: 96 mmol/L — ABNORMAL LOW (ref 98–111)
Creatinine, Ser: 0.81 mg/dL (ref 0.61–1.24)
GFR calc Af Amer: >60 mL/min (ref >60)
GFR calc non Af Amer: >60 mL/min (ref >60)
Glucose, Bld: 94 mg/dL (ref 70–99)
Potassium: 3.8 mmol/L (ref 3.5–5.1)
Sodium: 137 mmol/L (ref 135–145)

## 2019-10-10 LAB — CBC
HCT: 37 % — ABNORMAL LOW (ref 39.0–52.0)
Hemoglobin: 12.8 g/dL — ABNORMAL LOW (ref 13.0–17.0)
MCH: 29.4 pg (ref 26.0–34.0)
MCHC: 34.6 g/dL (ref 30.0–36.0)
MCV: 84.9 fL (ref 80.0–100.0)
Platelets: 297 10*3/uL (ref 150–400)
RBC: 4.36 MIL/uL (ref 4.22–5.81)
RDW: 14.2 % (ref 11.5–15.5)
WBC: 5.2 10*3/uL (ref 4.0–10.5)
nRBC: 0 % (ref 0.0–0.2)

## 2019-10-10 LAB — SARS CORONAVIRUS 2 (TAT 6-24 HRS): SARS Coronavirus 2: NEGATIVE

## 2019-10-12 ENCOUNTER — Encounter
Admission: RE | Disposition: A | Discharge status: Home / Self Care | Payer: Self-pay | Source: Home / Self Care | Attending: Urology

## 2019-10-12 ENCOUNTER — Ambulatory Visit: Payer: Medicare Other

## 2019-10-12 ENCOUNTER — Other Ambulatory Visit: Payer: Self-pay

## 2019-10-12 ENCOUNTER — Ambulatory Visit
Admission: RE | Admit: 2019-10-12 | Discharge: 2019-10-12 | Disposition: A | Discharge status: Home / Self Care | Payer: Medicare Other | Source: Ambulatory Visit | Attending: Urology | Admitting: Urology

## 2019-10-12 ENCOUNTER — Ambulatory Visit
Admission: RE | Admit: 2019-10-12 | Discharge: 2019-10-12 | Disposition: A | Discharge status: Home / Self Care | Payer: Medicare Other | Attending: Urology | Admitting: Urology

## 2019-10-12 ENCOUNTER — Encounter: Payer: Self-pay | Admitting: Urology

## 2019-10-12 DIAGNOSIS — L409 Psoriasis, unspecified: Secondary | ICD-10-CM | POA: Insufficient documentation

## 2019-10-12 DIAGNOSIS — R972 Elevated prostate specific antigen [PSA]: Secondary | ICD-10-CM

## 2019-10-12 DIAGNOSIS — F1210 Cannabis abuse, uncomplicated: Secondary | ICD-10-CM | POA: Insufficient documentation

## 2019-10-12 DIAGNOSIS — R7303 Prediabetes: Secondary | ICD-10-CM | POA: Diagnosis not present

## 2019-10-12 DIAGNOSIS — Z8042 Family history of malignant neoplasm of prostate: Secondary | ICD-10-CM | POA: Diagnosis not present

## 2019-10-12 DIAGNOSIS — I10 Essential (primary) hypertension: Secondary | ICD-10-CM | POA: Diagnosis not present

## 2019-10-12 DIAGNOSIS — C61 Malignant neoplasm of prostate: Secondary | ICD-10-CM | POA: Diagnosis not present

## 2019-10-12 DIAGNOSIS — F17210 Nicotine dependence, cigarettes, uncomplicated: Secondary | ICD-10-CM | POA: Insufficient documentation

## 2019-10-12 HISTORY — PX: PROSTATE BIOPSY: SHX241

## 2019-10-12 HISTORY — PX: TRANSRECTAL ULTRASOUND: SHX5146

## 2019-10-12 LAB — GLUCOSE, CAPILLARY: Glucose-Capillary: 93 mg/dL (ref 70–99)

## 2019-10-12 LAB — URINE DRUG SCREEN, QUALITATIVE (ARMC ONLY)
Amphetamines, Ur Screen: NOT DETECTED
Barbiturates, Ur Screen: NOT DETECTED
Benzodiazepine, Ur Scrn: NOT DETECTED
Cannabinoid 50 Ng, Ur ~~LOC~~: POSITIVE — AB
Cocaine Metabolite,Ur ~~LOC~~: NOT DETECTED
MDMA (Ecstasy)Ur Screen: NOT DETECTED
Methadone Scn, Ur: NOT DETECTED
Opiate, Ur Screen: NOT DETECTED
Phencyclidine (PCP) Ur S: NOT DETECTED
Tricyclic, Ur Screen: NOT DETECTED

## 2019-10-12 SURGERY — BIOPSY, PROSTATE
Anesthesia: General

## 2019-10-12 MED ORDER — GENTAMICIN SULFATE 40 MG/ML IJ SOLN
80.0000 mg | Freq: Once | INTRAMUSCULAR | Status: AC
  Administered 2019-10-12: 08:00:00 80 mg via INTRAMUSCULAR
  Filled 2019-10-12: qty 2

## 2019-10-12 MED ORDER — ACETAMINOPHEN 160 MG/5ML PO SOLN
325.0000 mg | ORAL | Status: DC | PRN
  Filled 2019-10-12: qty 20.3

## 2019-10-12 MED ORDER — CIPROFLOXACIN IN D5W 400 MG/200ML IV SOLN
INTRAVENOUS | Status: AC
  Filled 2019-10-12: qty 200

## 2019-10-12 MED ORDER — ACETAMINOPHEN 325 MG PO TABS: 325.0000 mg | ORAL_TABLET | ORAL | Status: DC | PRN

## 2019-10-12 MED ORDER — ONDANSETRON HCL 4 MG/2ML IJ SOLN
INTRAMUSCULAR | Status: DC | PRN
  Administered 2019-10-12: 4 mg via INTRAVENOUS

## 2019-10-12 MED ORDER — HYDROCODONE-ACETAMINOPHEN 7.5-325 MG PO TABS
1.0000 | ORAL_TABLET | Freq: Once | ORAL | Status: DC | PRN
  Filled 2019-10-12: qty 1

## 2019-10-12 MED ORDER — PROMETHAZINE HCL 25 MG/ML IJ SOLN: 6.2500 mg | INTRAMUSCULAR | Status: DC | PRN

## 2019-10-12 MED ORDER — FENTANYL CITRATE (PF) 100 MCG/2ML IJ SOLN: 25.0000 ug | INTRAMUSCULAR | Status: DC | PRN

## 2019-10-12 MED ORDER — PROPOFOL 500 MG/50ML IV EMUL
INTRAVENOUS | Status: DC | PRN
  Administered 2019-10-12: 50 ug/kg/min via INTRAVENOUS
  Administered 2019-10-12: 75 ug/kg/min via INTRAVENOUS

## 2019-10-12 MED ORDER — MIDAZOLAM HCL 2 MG/2ML IJ SOLN
INTRAMUSCULAR | Status: AC
  Filled 2019-10-12: qty 2

## 2019-10-12 MED ORDER — LIDOCAINE HCL (PF) 2 % IJ SOLN
INTRAMUSCULAR | Status: AC
  Filled 2019-10-12: qty 5

## 2019-10-12 MED ORDER — MEPERIDINE HCL 50 MG/ML IJ SOLN: 6.2500 mg | INTRAMUSCULAR | Status: DC | PRN

## 2019-10-12 MED ORDER — SODIUM CHLORIDE 0.9 % IV SOLN: INTRAVENOUS | Status: DC

## 2019-10-12 MED ORDER — PROPOFOL 500 MG/50ML IV EMUL
INTRAVENOUS | Status: AC
  Filled 2019-10-12: qty 50

## 2019-10-12 MED ORDER — FENTANYL CITRATE (PF) 100 MCG/2ML IJ SOLN
INTRAMUSCULAR | Status: DC | PRN
  Administered 2019-10-12 (×2): 25 ug via INTRAVENOUS

## 2019-10-12 MED ORDER — ONDANSETRON HCL 4 MG/2ML IJ SOLN
INTRAMUSCULAR | Status: AC
  Filled 2019-10-12: qty 2

## 2019-10-12 MED ORDER — ACETAMINOPHEN 10 MG/ML IV SOLN: 1000.0000 mg | Freq: Once | INTRAVENOUS | Status: DC | PRN

## 2019-10-12 MED ORDER — FAMOTIDINE 20 MG PO TABS
ORAL_TABLET | ORAL | Status: AC
  Administered 2019-10-12: 20 mg
  Filled 2019-10-12: qty 1

## 2019-10-12 MED ORDER — MIDAZOLAM HCL 2 MG/2ML IJ SOLN
INTRAMUSCULAR | Status: DC | PRN
  Administered 2019-10-12: 2 mg via INTRAVENOUS

## 2019-10-12 MED ORDER — FENTANYL CITRATE (PF) 100 MCG/2ML IJ SOLN
INTRAMUSCULAR | Status: AC
  Filled 2019-10-12: qty 2

## 2019-10-12 MED ORDER — CIPROFLOXACIN IN D5W 400 MG/200ML IV SOLN
400.0000 mg | INTRAVENOUS | Status: AC
  Administered 2019-10-12: 400 mg via INTRAVENOUS

## 2019-10-12 MED ORDER — LIDOCAINE HCL (CARDIAC) PF 100 MG/5ML IV SOSY
PREFILLED_SYRINGE | INTRAVENOUS | Status: DC | PRN
  Administered 2019-10-12: 50 mg via INTRAVENOUS

## 2019-10-12 SURGICAL SUPPLY — 24 items
BAG URINE DRAIN 2000ML AR STRL (UROLOGICAL SUPPLIES) ×2 IMPLANT
BLADE CLIPPER SURG (BLADE) ×2 IMPLANT
COVER WAND RF STERILE (DRAPES) ×2 IMPLANT
DRAPE 3/4 80X56 (DRAPES) ×2 IMPLANT
DRSG TELFA 3X8 NADH (GAUZE/BANDAGES/DRESSINGS) ×2 IMPLANT
DRSG TELFA 4X3 1S NADH ST (GAUZE/BANDAGES/DRESSINGS) ×2 IMPLANT
GAUZE SPONGE 4X4 12PLY STRL (GAUZE/BANDAGES/DRESSINGS) ×2 IMPLANT
GLOVE BIO SURGEON STRL SZ 6.5 (GLOVE) ×2 IMPLANT
GLOVE BIO SURGEON STRL SZ7 (GLOVE) ×4 IMPLANT
GUIDE NDL ENDOCAV 16-18 CVR (NEEDLE) ×1 IMPLANT
GUIDE NEEDLE ENDOCAV 16-18 CVR (NEEDLE) ×2 IMPLANT
INST BIOPSY MAXCORE 18GX25 (NEEDLE) ×2 IMPLANT
KIT TURNOVER CYSTO (KITS) ×2 IMPLANT
NDL DEFLUX 3.7X23X350 (MISCELLANEOUS) ×2 IMPLANT
NDL GUIDE BIOPSY 644068 (NEEDLE) ×1 IMPLANT
NDL SAFETY ECLIPSE 18X1.5 (NEEDLE) ×1 IMPLANT
NEEDLE DEFLUX 3.7X23X350 (MISCELLANEOUS) ×1 IMPLANT
NEEDLE GUIDE BIOPSY 644068 (NEEDLE) ×2 IMPLANT
NEEDLE HYPO 18GX1.5 SHARP (NEEDLE) ×1
NEEDLE HYPO 22GX1.5 SAFETY (NEEDLE) ×2 IMPLANT
PAD DRESSING TELFA 3X8 NADH (GAUZE/BANDAGES/DRESSINGS) ×1 IMPLANT
SYR 10ML LL (SYRINGE) ×2 IMPLANT
SYRINGE IRR TOOMEY STRL 70CC (SYRINGE) ×2 IMPLANT
WATER STERILE IRR 1000ML POUR (IV SOLUTION) ×2 IMPLANT

## 2019-10-12 NOTE — Transfer of Care (Signed)
Immediate Anesthesia Transfer of Care Note  Patient: Jonathan Garza  Procedure(s) Performed: PROSTATE BIOPSY (N/A ) TRANSRECTAL ULTRASOUND (N/A )  Patient Location: PACU  Anesthesia Type:MAC  Level of Consciousness: drowsy  Airway & Oxygen Therapy: Patient Spontanous Breathing and Patient connected to face mask oxygen  Post-op Assessment: Report given to RN and Post -op Vital signs reviewed and stable  Post vital signs: Reviewed and stable  Last Vitals:  Vitals Value Taken Time  BP 92/60 10/12/19 1045  Temp    Pulse 56 10/12/19 1047  Resp 15 10/12/19 1047  SpO2 98 % 10/12/19 1047  Vitals shown include unvalidated device data.  Last Pain:  Vitals:   10/12/19 0809  TempSrc: Oral  PainSc: 2          Complications: No apparent anesthesia complications

## 2019-10-12 NOTE — Discharge Instructions (Signed)
AMBULATORY SURGERY  DISCHARGE INSTRUCTIONS   1) The drugs that you were given will stay in your system until tomorrow so for the next 24 hours you should not:  A) Drive an automobile B) Make any legal decisions C) Drink any alcoholic beverage   2) You may resume regular meals tomorrow.  Today it is better to start with liquids and gradually work up to solid foods.  You may eat anything you prefer, but it is better to start with liquids, then soup and crackers, and gradually work up to solid foods.   3) Please notify your doctor immediately if you have any unusual bleeding, trouble breathing, redness and pain at the surgery site, drainage, fever, or pain not relieved by medication.    4) Additional Instructions:   Dr Diamantina Providence will call with biopsy results and a plan.  Use Tylenol or motrin for pain as needed       Please contact your physician with any problems or Same Day Surgery at [phone], Monday through Friday 6 am to 4 pm, or [place] at [facility] number at [phone].
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
## 2019-10-12 NOTE — Op Note (Signed)
UROLOGY OPERATIVE NOTE  PATIENT:  Jonathan Garza    PRE-OPERATIVE DIAGNOSIS:  Elevated PSA  POST-OPERATIVE DIAGNOSIS:  Elevated PSA  PROCEDURE:  Procedure(s): PROSTATE BIOPSY (N/A) TRANSRECTAL ULTRASOUND (N/A)  SURGEON:  Surgeon(s) and Role:    * Cilicia Borden, Herbert Seta, MD - Primary  ANESTHESIA: MAC  EBL:  Minimal  Drains: None  Specimen: Prostate cores   Findings:  1.  Uncomplicated prostate biopsy  Indications: The patient is a 54 year old male with a family history of lethal prostate cancer who presented with a PSA of 6.9 and was unable to tolerate prostate biopsy in clinic.  Prostate MRI showed concerning PI-RADS four lesions in the peripheral zone bilaterally.  We discussed the risks and benefits of the procedure at length including bleeding, infection/sepsis, and need for further treatments pending pathology results.  Description of procedure in detail: A timeout was performed in monitored anesthesia care was provided with the patient in the left lateral decubitus position.  Preop antibiotics of gentamicin and Cipro were given, and SCDs were placed.  When adequate sedation was obtained, the transrectal ultrasound was inserted into the rectum.  Transrectal ultrasound was used to perform a standard 12 core biopsy template.  A cognitive fusion biopsy was performed of the two regions of interest with two additional cores taken from each area of interest at the bilateral peripheral zones.  The probe was removed and there was no bleeding noted.  DISPO: Follow-up to discuss pathology results  Nickolas Madrid, MD 10/12/2019

## 2019-10-16 LAB — SURGICAL PATHOLOGY

## 2019-10-19 NOTE — Anesthesia Postprocedure Evaluation (Signed)
Anesthesia Post Note  Patient: Chief Operating Officer  Procedure(s) Performed: PROSTATE BIOPSY (N/A ) TRANSRECTAL ULTRASOUND (N/A )  Patient location during evaluation: PACU Anesthesia Type: General Level of consciousness: awake and alert Pain management: pain level controlled Vital Signs Assessment: post-procedure vital signs reviewed and stable Respiratory status: spontaneous breathing, nonlabored ventilation and respiratory function stable Cardiovascular status: blood pressure returned to baseline and stable Postop Assessment: no apparent nausea or vomiting Anesthetic complications: no     Last Vitals:  Vitals:   10/12/19 1115 10/12/19 1148  BP: 98/71 119/87  Pulse: (!) 56 (!) 59  Resp: 16 16  Temp:  36.4 C  SpO2: 100% 100%    Last Pain:  Vitals:   10/13/19 0809  TempSrc:   PainSc: 0-No pain                 Alphonsus Sias

## 2019-10-20 ENCOUNTER — Telehealth: Payer: Self-pay | Admitting: Urology

## 2019-10-20 NOTE — Telephone Encounter (Signed)
-----   Message from Billey Co, MD sent at 10/19/2019  3:31 PM EST ----- Regarding: follow up Please schedule follow-up next week in clinic to discuss pathology results from prostate biopsy.  Okay to Dorene Ar, MD 10/19/2019

## 2019-10-20 NOTE — Telephone Encounter (Signed)
done

## 2019-10-25 ENCOUNTER — Other Ambulatory Visit: Payer: Self-pay

## 2019-10-25 ENCOUNTER — Encounter: Payer: Self-pay | Admitting: Urology

## 2019-10-25 ENCOUNTER — Ambulatory Visit (INDEPENDENT_AMBULATORY_CARE_PROVIDER_SITE_OTHER): Payer: Medicare Other | Admitting: Urology

## 2019-10-25 VITALS — BP 134/87 | HR 59 | Ht 65.0 in | Wt 162.0 lb

## 2019-10-25 DIAGNOSIS — C61 Malignant neoplasm of prostate: Secondary | ICD-10-CM

## 2019-10-25 NOTE — Patient Instructions (Signed)
Prostate Cancer  The prostate is a walnut-sized gland that is involved in the production of semen. It is located below a man's bladder, in front of the rectum. Prostate cancer is the abnormal growth of cells in the prostate gland. What are the causes? The exact cause of this condition is not known. What increases the risk? This condition is more likely to develop in men who:  Are older than age 54.  Are African-American.  Are obese.  Have a family history of prostate cancer.  Have a family history of breast cancer. What are the signs or symptoms? Symptoms of this condition include:  A need to urinate often.  Weak or interrupted flow of urine.  Trouble starting or stopping urination.  Inability to urinate.  Pain or burning during urination.  Painful ejaculation.  Blood in urine or semen.  Persistent pain or discomfort in the lower back, lower abdomen, hips, or upper thighs.  Trouble getting an erection.  Trouble emptying the bladder all the way. How is this diagnosed? This condition can be diagnosed with:  A digital rectal exam. For this exam, a health care provider inserts a gloved finger into the rectum to feel the prostate gland.  A blood test called a prostate-specific antigen (PSA) test.  An imaging test called transrectal ultrasonography.  A procedure in which a sample of tissue is taken from the prostate and examined under a microscope (prostate biopsy). Once the condition is diagnosed, tests will be done to determine how far the cancer has spread. This is called staging the cancer. Staging may involve imaging tests, such as:  A bone scan.  A CT scan.  A PET scan.  An MRI. The stages of prostate cancer are as follows:  Stage I. At this stage, the cancer is found in the prostate only. The cancer is not visible on imaging tests and it is usually found by accident, such as during a prostate surgery.  Stage II. At this stage, the cancer is more advanced  than it is in stage I, but the cancer has not spread outside the prostate.  Stage III. At this stage, the cancer has spread beyond the outer layer of the prostate to nearby tissues. The cancer may be found in the seminal vesicles, which are near the bladder and the prostate.  Stage IV. At this stage, the cancer has spread other parts of the body, such as the lymph nodes, bones, bladder, rectum, liver, or lungs. How is this treated? Treatment for this condition depends on several factors, including the stage of the cancer, your age, personal preferences, and your overall health. Talk with your health care provider about treatment options that are recommended for you. Common treatments include:  Observation for early stage prostate cancer (active surveillance). This involves having exams, blood tests, and in some cases, more biopsies. For some men, this is the only treatment needed.  Surgery. Types of surgeries include: ? Open surgery. In this surgery, a larger incision is made to remove the prostate. ? A laparoscopic prostatectomy. This is a surgery to remove the prostate and lymph nodes through several, small incisions. It is often referred to as a minimally invasive surgery. ? A robotic prostatectomy. This is a surgery to remove the prostate and lymph nodes with the help of a robotic arm that is controlled by a computer. ? Orchiectomy. This is a surgery to remove the testicles. ? Cryosurgery. This is a surgery to freeze and destroy cancer cells.  Radiation treatment. Types   of radiation treatment include: ? External beam radiation. This type aims beams of radiation from outside the body at the prostate to destroy cancerous cells. ? Brachytherapy. This type uses radioactive needles, seeds, wires, or tubes that are implanted into the prostate gland. Like external beam radiation, brachytherapy destroys cancerous cells. An advantage is that this type of radiation limits the damage to surrounding  tissue and has fewer side effects.  High-intensity, focused ultrasonography. This treatment destroys cancer cells by delivering high-energy ultrasound waves to the cancerous cells.  Chemotherapy medicines. This treatment kills cancer cells or stops them from multiplying.  Hormone treatment. This treatment involves taking medicines that act on one of the male hormones (testosterone): ? By stopping your body from producing testosterone. ? By blocking testosterone from reaching cancer cells. Follow these instructions at home:  Take over-the-counter and prescription medicines only as told by your health care provider.  Maintain a healthy diet.  Get plenty of sleep.  Consider joining a support group for men who have prostate cancer. Meeting with a support group may help you learn to cope with the stress of having cancer.  Keep all follow-up visits as told by your health care provider. This is important.  If you have to go to the hospital, notify your cancer specialist (oncologist).  Treatment for prostate cancer may affect sexual function. Continue to have intimate moments with your partner. This may include touching, holding, hugging, and caressing. Contact a health care provider if:  You have trouble urinating.  You have blood in your urine.  You have pain in your hips, back, or chest. Get help right away if:  You have weakness or numbness in your legs.  You cannot control urination or your bowel movements (incontinence).  You have trouble breathing.  You have sudden chest pain.  You have chills or a fever. Summary  The prostate is a walnut-sized gland that is involved in the production of semen. It is located below a man's bladder, in front of the rectum. Prostate cancer is the abnormal growth of cells in the prostate gland.  Treatment for this condition depends on several factors, including the stage of the cancer, your age, personal preferences, and your overall health.  Talk with your health care provider about treatment options that are recommended for you.  Consider joining a support group for men who have prostate cancer. Meeting with a support group may help you learn to cope with the stress of having cancer. This information is not intended to replace advice given to you by your health care provider. Make sure you discuss any questions you have with your health care provider. Document Revised: 07/30/2017 Document Reviewed: 04/27/2016 Elsevier Patient Education  2020 [employer] Inc.  

## 2019-10-25 NOTE — Progress Notes (Signed)
10/25/2019 11:49 AM   Jonathan Garza Apr 23, 1966 [id number]  Reason for visit: Discuss prostate biopsy results  HPI: I saw Jonathan Garza back in urology clinic to discuss his prostate biopsy results.  To briefly summarize, he is a 54 year old African-American male with a family history of lethal prostate cancer at a young age in his father and uncle who presented with an elevated PSA of 6.9(7.5% free).  He did not tolerate prostate biopsy in clinic even with Valium, and this ultimately had to be performed in the operating room.  He underwent an MRI prior to biopsy which showed PI-RADS 4 lesions in the right and left peripheral zone worrisome for malignancy, including bulging along the left prostate margin.  Volume was 21 mL for a concerning PSA density of 0.33.   His past medical history is notable for recurrent cellulitis, hypertension, and pre-diabetes.  He denies any prior abdominal surgeries.  He denies any difficulty with erections.  He denies any urinary symptoms.  He was alone in clinic today, but his mom was on speaker phone for our entire conversation regarding his new diagnosis of prostate cancer.  We had a lengthy conversation today about the patient's new diagnosis of prostate cancer.  We reviewed the risk classifications per the AUA guidelines including very low risk, low risk, intermediate risk, and high risk disease, and the need for additional staging imaging with CT and bone scan in patients with unfavorable intermediate risk and high risk disease.  I explained that his life expectancy, clinical stage, Gleason score, PSA, and other co-morbidities influence treatment strategies.  We discussed the roles of active surveillance, radiation therapy, surgical therapy with robotic prostatectomy, and hormone therapy with androgen deprivation.  We discussed that patients urinary symptoms also impact treatment strategy, as patients with severe lower urinary tract symptoms may have significant  worsening or even develop urinary retention after undergoing radiation.  In regards to surgery, we discussed robotic prostatectomy +/- lymphadenectomy at length.  The procedure takes 3 to 4 hours, and patient's typically discharge home on post-op day #1.  A Foley catheter is left in place for 7 to 10 days to allow for healing of the vesicourethral anastomosis.  There is a small risk of bleeding, infection, damage to surrounding structures or bowel, hernia, DVT/PE, or serious cardiac or pulmonary complications.  We discussed at length post-op side effects including erectile dysfunction, and the importance of pre-operative erectile function on long-term outcomes.  Even with a nerve sparing approach, there is an approximately 25% rate of permanent erectile dysfunction.  We also discussed postop urinary incontinence at length.  We expect patients to have stress incontinence post-operatively that will improve over period of weeks to months.  Less than 10% of men will require a pad at 1 year after surgery.  Patients will need to avoid heavy lifting and strenuous activity for 3 to 4 weeks, but most men return to their baseline activity status by 6 weeks.  In summary, Jonathan Garza is a 54 y.o. man with newly diagnosed low risk prostate cancer.  We discussed that we typically would manage these patients with active surveillance, however with his concerning MRI findings, high PSA density, strong family history of lethal prostate cancer at a young age, and inability to tolerate prostate biopsy in clinic, he is a much better candidate for definitive management with either surgery or radiation.  He would like to pursue radiation.  -Referral placed to Dr. Baruch Gouty for consideration of radiation. -He cannot tolerate any transrectal  procedures in clinic, and if he requires gold seed placement this would have to be done in the operating room -RTC 9 months with PSA prior   Billey Co, MD  [facility] [address], [place],  [id number] [phone]

## 2019-10-30 ENCOUNTER — Encounter: Payer: Self-pay | Admitting: Radiation Oncology

## 2019-10-30 ENCOUNTER — Ambulatory Visit
Admission: RE | Admit: 2019-10-30 | Discharge: 2019-10-30 | Disposition: A | Discharge status: Home / Self Care | Payer: Medicare Other | Source: Ambulatory Visit | Attending: Radiation Oncology | Admitting: Radiation Oncology

## 2019-10-30 ENCOUNTER — Other Ambulatory Visit: Payer: Self-pay

## 2019-10-30 VITALS — BP 116/81 | HR 51 | Temp 96.7°F | Resp 16 | Wt 173.8 lb

## 2019-10-30 DIAGNOSIS — C61 Malignant neoplasm of prostate: Secondary | ICD-10-CM | POA: Diagnosis present

## 2019-10-30 DIAGNOSIS — L409 Psoriasis, unspecified: Secondary | ICD-10-CM | POA: Diagnosis not present

## 2019-10-30 DIAGNOSIS — Z79899 Other long term (current) drug therapy: Secondary | ICD-10-CM | POA: Diagnosis not present

## 2019-10-30 DIAGNOSIS — F17210 Nicotine dependence, cigarettes, uncomplicated: Secondary | ICD-10-CM | POA: Diagnosis not present

## 2019-10-30 DIAGNOSIS — Z809 Family history of malignant neoplasm, unspecified: Secondary | ICD-10-CM | POA: Insufficient documentation

## 2019-10-30 DIAGNOSIS — I10 Essential (primary) hypertension: Secondary | ICD-10-CM | POA: Diagnosis not present
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
## 2020-01-04 ENCOUNTER — Other Ambulatory Visit
Admission: RE | Admit: 2020-01-04 | Discharge: 2020-01-04 | Disposition: A | Discharge status: Home / Self Care | Payer: Medicare Other | Source: Ambulatory Visit | Attending: Radiation Oncology | Admitting: Radiation Oncology

## 2020-01-04 DIAGNOSIS — Z20822 Contact with and (suspected) exposure to covid-19: Secondary | ICD-10-CM | POA: Insufficient documentation

## 2020-01-04 DIAGNOSIS — Z01812 Encounter for preprocedural laboratory examination: Secondary | ICD-10-CM | POA: Insufficient documentation

## 2020-01-04 LAB — SARS CORONAVIRUS 2 (TAT 6-24 HRS): SARS Coronavirus 2: NEGATIVE

## 2020-01-08 ENCOUNTER — Ambulatory Visit: Payer: Medicare Other

## 2020-01-08 MED ORDER — DEXAMETHASONE SODIUM PHOSPHATE 10 MG/ML IJ SOLN
INTRAMUSCULAR | Status: AC
  Filled 2020-01-08: qty 1

## 2020-01-08 MED ORDER — ONDANSETRON HCL 4 MG/2ML IJ SOLN
INTRAMUSCULAR | Status: AC
  Filled 2020-01-08: qty 2

## 2020-01-08 MED ORDER — LIDOCAINE HCL (PF) 2 % IJ SOLN
INTRAMUSCULAR | Status: AC
  Filled 2020-01-08: qty 5

## 2020-01-08 MED ORDER — FENTANYL CITRATE (PF) 100 MCG/2ML IJ SOLN
INTRAMUSCULAR | Status: AC
  Filled 2020-01-08: qty 2

## 2020-01-08 MED ORDER — GLYCOPYRROLATE 0.2 MG/ML IJ SOLN
INTRAMUSCULAR | Status: AC
  Filled 2020-01-08: qty 1

## 2020-01-08 MED ORDER — PROPOFOL 10 MG/ML IV BOLUS
INTRAVENOUS | Status: AC
  Filled 2020-01-08: qty 20

## 2020-01-09 ENCOUNTER — Ambulatory Visit: Payer: Medicare Other | Admitting: Radiation Oncology

## 2020-01-09 ENCOUNTER — Other Ambulatory Visit: Payer: Self-pay

## 2020-01-09 ENCOUNTER — Ambulatory Visit
Admission: RE | Admit: 2020-01-09 | Discharge: 2020-01-09 | Disposition: A | Discharge status: Home / Self Care | Payer: Medicare Other | Source: Ambulatory Visit | Attending: Radiation Oncology | Admitting: Radiation Oncology

## 2020-01-09 VITALS — BP 133/93 | HR 51 | Temp 95.2°F | Resp 16 | Wt 161.2 lb

## 2020-01-09 DIAGNOSIS — C61 Malignant neoplasm of prostate: Secondary | ICD-10-CM

## 2020-01-09 NOTE — Progress Notes (Signed)
Radiation Oncology Follow up Note  Name: Kavien Glorioso   Date:   01/09/2020 MRN:  [id number] DOB: 1965/12/05    This 54 y.o. male presents to the clinic today for reevaluation since patient missed his volume study in anticipation of I-125 interstitial implant for Gleason 6 adenocarcinoma the prostate presenting with a PSA of 6.9  REFERRING PROVIDER: [facility], [facility]*  HPI: Patient missed his volume study yesterday and was told he never received confirmation of that appointment.  We are rescheduling that for June.  I told him this will make no difference in his clinical outcome.  He is not a candidate for daily radiation therapy based on transportation issues and distant from the hospital..  COMPLICATIONS OF TREATMENT: none  FOLLOW UP COMPLIANCE: misses appointments   PHYSICAL EXAM:  BP (!) 133/93 (BP Location: Left Arm, Patient Position: Sitting)   Pulse (!) 51   Temp (!) 95.2 F (35.1 C) (Tympanic)   Resp 16   Wt 161 lb 3.2 oz (73.1 kg)   BMI 26.83 kg/m  No exam was performed  RADIOLOGY RESULTS: No current films for review  PLAN: Explained to him the situation that is paramount importance to do the volume study prior to the seed implant.  We have we will reschedule this for sometime in June hopefully be able to implant him in July.  He is comfortable with that plan.  I would like to take this opportunity to thank you for allowing me to participate in the care of your patient.[name], MD

## 2020-01-18 ENCOUNTER — Other Ambulatory Visit: Payer: Self-pay | Admitting: Urology

## 2020-01-18 DIAGNOSIS — C61 Malignant neoplasm of prostate: Secondary | ICD-10-CM

## 2020-01-26 ENCOUNTER — Encounter
Admission: RE | Admit: 2020-01-26 | Discharge: 2020-01-26 | Disposition: A | Discharge status: Home / Self Care | Payer: Medicare Other | Source: Ambulatory Visit | Attending: Urology | Admitting: Urology

## 2020-01-26 ENCOUNTER — Other Ambulatory Visit: Payer: Self-pay

## 2020-01-26 NOTE — Patient Instructions (Signed)
Your procedure is scheduled on: Thursday March 07, 2020 Report to Day Surgery. To find out your arrival time please call [phone] between 1PM - 3PM on Wednesday March 08, 2020.  Remember: Instructions that are not followed completely may result in serious medical risk,  up to and including death, or upon the discretion of your surgeon and anesthesiologist your  surgery may need to be rescheduled.     _X__ 1. Do not eat food after midnight the night before your procedure.                 No gum chewing or hard candies. You may drink clear liquids up to 2 hours                 before you are scheduled to arrive for your surgery- DO not drink clear                 liquids within 2 hours of the start of your surgery.                 Clear Liquids include:  water, apple juice without pulp, clear Gatorade, G2 or                  Gatorade Zero (avoid Red/Purple/Blue), Black Coffee or Tea (Do not add                 anything to coffee or tea).  __X__2.  On the morning of surgery brush your teeth with toothpaste and water, you                may rinse your mouth with mouthwash if you wish.  Do not swallow any toothpaste of mouthwash.     _X__ 3.  No Alcohol for 24 hours before or after surgery.   _X__ 4.  Do Not Smoke or use e-cigarettes For 24 Hours Prior to Your Surgery.                 Do not use any chewable tobacco products for at least 6 hours prior to                 Surgery.  _X__  5.  Do not use any recreational drugs (marijuana, cocaine, heroin, ecstacy, MDMA or other)                For at least one week prior to your surgery.  Combination of these drugs with anesthesia                May have life threatening results.  __X_ 6.  Notify your doctor if there is any change in your medical condition      (cold, fever, infections).     Do not wear jewelry, make-up, hairpins, clips or nail polish. Do not wear lotions, powders, or perfumes. You may wear  deodorant. Do not shave 48 hours prior to surgery. Men may shave face and neck. Do not bring valuables to the hospital.    [facility] is not responsible for any belongings or valuables.  Contacts, dentures or bridgework may not be worn into surgery. Leave your suitcase in the car. After surgery it may be brought to your room. For patients admitted to the hospital, discharge time is determined by your treatment team.   Patients discharged the day of surgery will not be allowed to drive home.   Make arrangements for someone to be with you for the first  24 hours of your Same Day Discharge.   ____ Take these medicines the morning of surgery with A SIP OF WATER:    1. None    __X__ Fleet Enema (as directed) use this for procedure March 07, 2020  __X__ Shower the day of your surgery.  __X_ Stop Anti-inflammatories such as BC powders, Aleve, naproxen, ibuprofen and or aspirins   __X__ Stop supplements until after surgery.    __X__ Do not start any herbal supplements before your surgery.

## 2020-02-01 ENCOUNTER — Other Ambulatory Visit
Admission: RE | Admit: 2020-02-01 | Discharge: 2020-02-01 | Disposition: A | Discharge status: Home / Self Care | Payer: Medicare Other | Source: Ambulatory Visit | Attending: Radiation Oncology | Admitting: Radiation Oncology

## 2020-02-01 NOTE — Progress Notes (Signed)
No show for covid test today. Call to patient, spoke with patient and was instructed to come tomorrow (Friday) between 8am-1pm. Acknowledged understanding.

## 2020-02-02 ENCOUNTER — Other Ambulatory Visit: Payer: Self-pay

## 2020-02-02 ENCOUNTER — Other Ambulatory Visit
Admission: RE | Admit: 2020-02-02 | Discharge: 2020-02-02 | Disposition: A | Discharge status: Home / Self Care | Payer: Medicare Other | Source: Ambulatory Visit | Attending: Radiation Oncology | Admitting: Radiation Oncology

## 2020-02-02 DIAGNOSIS — Z20822 Contact with and (suspected) exposure to covid-19: Secondary | ICD-10-CM | POA: Diagnosis not present

## 2020-02-02 DIAGNOSIS — Z01812 Encounter for preprocedural laboratory examination: Secondary | ICD-10-CM | POA: Insufficient documentation

## 2020-02-02 LAB — CBC
HCT: 39.2 % (ref 39.0–52.0)
Hemoglobin: 14.1 g/dL (ref 13.0–17.0)
MCH: 30 pg (ref 26.0–34.0)
MCHC: 36 g/dL (ref 30.0–36.0)
MCV: 83.4 fL (ref 80.0–100.0)
Platelets: 381 10*3/uL (ref 150–400)
RBC: 4.7 MIL/uL (ref 4.22–5.81)
RDW: 14.6 % (ref 11.5–15.5)
WBC: 5 10*3/uL (ref 4.0–10.5)
nRBC: 0 % (ref 0.0–0.2)

## 2020-02-02 LAB — SARS CORONAVIRUS 2 (TAT 6-24 HRS): SARS Coronavirus 2: NEGATIVE

## 2020-02-05 ENCOUNTER — Ambulatory Visit: Payer: Medicare Other | Admitting: Anesthesiology

## 2020-02-05 ENCOUNTER — Other Ambulatory Visit: Payer: Self-pay

## 2020-02-05 ENCOUNTER — Ambulatory Visit: Payer: Medicare Other

## 2020-02-05 ENCOUNTER — Encounter: Payer: Self-pay | Admitting: Urology

## 2020-02-05 ENCOUNTER — Ambulatory Visit
Admission: RE | Admit: 2020-02-05 | Discharge: 2020-02-05 | Disposition: A | Discharge status: Home / Self Care | Payer: Medicare Other | Attending: Urology | Admitting: Urology

## 2020-02-05 ENCOUNTER — Encounter
Admission: RE | Disposition: A | Discharge status: Home / Self Care | Payer: Self-pay | Source: Home / Self Care | Attending: Urology

## 2020-02-05 DIAGNOSIS — C61 Malignant neoplasm of prostate: Secondary | ICD-10-CM | POA: Diagnosis present

## 2020-02-05 HISTORY — PX: VOLUME STUDY: SHX6646

## 2020-02-05 LAB — URINE DRUG SCREEN, QUALITATIVE (ARMC ONLY)
Amphetamines, Ur Screen: NOT DETECTED
Barbiturates, Ur Screen: NOT DETECTED
Benzodiazepine, Ur Scrn: NOT DETECTED
Cannabinoid 50 Ng, Ur ~~LOC~~: POSITIVE — AB
Cocaine Metabolite,Ur ~~LOC~~: NOT DETECTED
MDMA (Ecstasy)Ur Screen: NOT DETECTED
Methadone Scn, Ur: NOT DETECTED
Opiate, Ur Screen: NOT DETECTED
Phencyclidine (PCP) Ur S: NOT DETECTED
Tricyclic, Ur Screen: NOT DETECTED

## 2020-02-05 SURGERY — ULTRASOUND, PROSTATE, FOR VOLUME DETERMINATION
Anesthesia: General

## 2020-02-05 MED ORDER — PROPOFOL 10 MG/ML IV BOLUS
INTRAVENOUS | Status: DC | PRN
  Administered 2020-02-05: 50 mg via INTRAVENOUS
  Administered 2020-02-05: 100 mg via INTRAVENOUS

## 2020-02-05 MED ORDER — LIDOCAINE HCL (CARDIAC) PF 100 MG/5ML IV SOSY
PREFILLED_SYRINGE | INTRAVENOUS | Status: DC | PRN
  Administered 2020-02-05: 40 mg via INTRAVENOUS
  Administered 2020-02-05: 60 mg via INTRAVENOUS

## 2020-02-05 MED ORDER — GLYCOPYRROLATE 0.2 MG/ML IJ SOLN
INTRAMUSCULAR | Status: DC | PRN
  Administered 2020-02-05: .2 mg via INTRAVENOUS

## 2020-02-05 MED ORDER — CIPROFLOXACIN IN D5W 400 MG/200ML IV SOLN
400.0000 mg | INTRAVENOUS | Status: AC
  Administered 2020-02-05: 400 mg via INTRAVENOUS

## 2020-02-05 MED ORDER — PROPOFOL 500 MG/50ML IV EMUL
INTRAVENOUS | Status: AC
  Filled 2020-02-05: qty 50

## 2020-02-05 MED ORDER — LACTATED RINGERS IV SOLN: INTRAVENOUS | Status: DC

## 2020-02-05 MED ORDER — FAMOTIDINE 20 MG PO TABS
20.0000 mg | ORAL_TABLET | Freq: Once | ORAL | Status: AC
  Administered 2020-02-05: 20 mg via ORAL

## 2020-02-05 MED ORDER — MIDAZOLAM HCL 2 MG/2ML IJ SOLN
INTRAMUSCULAR | Status: AC
  Filled 2020-02-05: qty 2

## 2020-02-05 MED ORDER — CHLORHEXIDINE GLUCONATE 0.12 % MT SOLN: 15.0000 mL | Freq: Once | OROMUCOSAL | Status: AC

## 2020-02-05 MED ORDER — FENTANYL CITRATE (PF) 100 MCG/2ML IJ SOLN
INTRAMUSCULAR | Status: AC
  Filled 2020-02-05: qty 2

## 2020-02-05 MED ORDER — CHLORHEXIDINE GLUCONATE 0.12 % MT SOLN
OROMUCOSAL | Status: AC
  Administered 2020-02-05: 15 mL via OROMUCOSAL
  Filled 2020-02-05: qty 15

## 2020-02-05 MED ORDER — FAMOTIDINE 20 MG PO TABS
ORAL_TABLET | ORAL | Status: AC
  Filled 2020-02-05: qty 1

## 2020-02-05 MED ORDER — PROPOFOL 500 MG/50ML IV EMUL
INTRAVENOUS | Status: DC | PRN
  Administered 2020-02-05: 100 ug/kg/min via INTRAVENOUS

## 2020-02-05 MED ORDER — LIDOCAINE HCL (PF) 2 % IJ SOLN
INTRAMUSCULAR | Status: AC
  Filled 2020-02-05: qty 5

## 2020-02-05 MED ORDER — CIPROFLOXACIN IN D5W 400 MG/200ML IV SOLN
INTRAVENOUS | Status: AC
  Filled 2020-02-05: qty 200

## 2020-02-05 MED ORDER — FENTANYL CITRATE (PF) 100 MCG/2ML IJ SOLN
INTRAMUSCULAR | Status: DC | PRN
  Administered 2020-02-05 (×2): 50 ug via INTRAVENOUS

## 2020-02-05 MED ORDER — MIDAZOLAM HCL 2 MG/2ML IJ SOLN
INTRAMUSCULAR | Status: DC | PRN
  Administered 2020-02-05 (×2): 1 mg via INTRAVENOUS

## 2020-02-05 MED ORDER — GLYCOPYRROLATE 0.2 MG/ML IJ SOLN
INTRAMUSCULAR | Status: AC
  Filled 2020-02-05: qty 1

## 2020-02-05 MED ORDER — ORAL CARE MOUTH RINSE: 15.0000 mL | Freq: Once | OROMUCOSAL | Status: AC

## 2020-02-05 NOTE — Discharge Instructions (Addendum)

## 2020-02-05 NOTE — Progress Notes (Signed)
Radiation Oncology Volume study note  Name: Vihaan Gloss   Date:   11/22/2019 MRN:  [id number] DOB: 1966/03/09    This 54 y.o. male presents to the hospital today for volume study in anticipation of I-125 interstitial implant for stage IIa adenocarcinoma the prostate  REFERRING PROVIDER: No ref. provider found  HPI: Patient is a 54 year old male taken to the OR for volume study in anticipation of an I-125 interstitial implant for stage IIa (T1 cN0 M0) Gleason 6 (3+3) adenocarcinoma prostate presenting with a PSA of 6.9.  Patient because of his inability to tolerate the procedure was given IV sedation.  COMPLICATIONS OF TREATMENT: none  FOLLOW UP COMPLIANCE: keeps appointments   PHYSICAL EXAM:  BP (!) 153/88   Pulse (!) 54   Temp (!) 97 F (36.1 C) (Temporal)   Resp 14   Ht 5\' 5"  (1.651 m)   Wt 158 lb 1.1 oz (71.7 kg)   SpO2 100%   BMI 26.30 kg/m  Well-developed well-nourished patient in NAD. HEENT reveals PERLA, EOMI, discs not visualized.  Oral cavity is clear. No oral mucosal lesions are identified. Neck is clear without evidence of cervical or supraclavicular adenopathy. Lungs are clear to A&P. Cardiac examination is essentially unremarkable with regular rate and rhythm without murmur rub or thrill. Abdomen is benign with no organomegaly or masses noted. Motor sensory and DTR levels are equal and symmetric in the upper and lower extremities. Cranial nerves II through XII are grossly intact. Proprioception is intact. No peripheral adenopathy or edema is identified. No motor or sensory levels are noted. Crude visual fields are within normal range.  RADIOLOGY RESULTS: Ultrasound probe used for volume study  PLAN: Patient was taken to the cystoscopy suite in the OR. Patient was placed in the low lithotomy position. Foley catheter was placed. Trans-rectal ultrasound probe was inserted into the rectum and prostate seminal vesicles were visualized as well as bladder base. stepping images  were performed on a 5 mm increments. Images will be placed in BrachyVision treatment planning system to determine seed placement coordinates for eventual I-125 interstitial implant. Images will be reviewed with the physics and dosimetry staff for final quality approval. I personally was present for the volume study and assisted in delineation of contour volumes.  At the end of the procedure Foley catheter was removed, rectal ultrasound probe was removed. Patient tolerated his procedures extremely well with no side effects or complaints. Patient has given appointment for interstitial implant date. Consent was signed today as well as history and physical performed in preparation for his outpatient surgical implant.     [name], MD

## 2020-02-05 NOTE — Anesthesia Postprocedure Evaluation (Signed)
Anesthesia Post Note  Patient: Chief Operating Officer  Procedure(s) Performed: VOLUME STUDY (N/A )  Patient location during evaluation: PACU Anesthesia Type: General Level of consciousness: awake and alert Pain management: pain level controlled Vital Signs Assessment: post-procedure vital signs reviewed and stable Respiratory status: spontaneous breathing, nonlabored ventilation and respiratory function stable Cardiovascular status: blood pressure returned to baseline and stable Postop Assessment: no apparent nausea or vomiting Anesthetic complications: no     Last Vitals:  Vitals:   02/05/20 0941 02/05/20 0949  BP: (!) 131/97 (!) 153/88  Pulse: (!) 49 (!) 54  Resp: 11 14  Temp: (!) 36.1 C (!) 36.1 C  SpO2: 98% 100%    Last Pain:  Vitals:   02/05/20 0949  TempSrc: Temporal  PainSc: 0-No pain                 Tera Mater

## 2020-02-05 NOTE — Transfer of Care (Signed)
Immediate Anesthesia Transfer of Care Note  Patient: Jonathan Garza  Procedure(s) Performed: VOLUME STUDY (N/A )  Patient Location: PACU  Anesthesia Type:General  Level of Consciousness: drowsy and patient cooperative  Airway & Oxygen Therapy: Patient Spontanous Breathing and Patient connected to nasal cannula oxygen  Post-op Assessment: Report given to RN and Post -op Vital signs reviewed and stable  Post vital signs: Reviewed and stable  Last Vitals:  Vitals Value Taken Time  BP 97/76 02/05/20 0908  Temp 36 C 02/05/20 0908  Pulse 61 02/05/20 0912  Resp 16 02/05/20 0912  SpO2 98 % 02/05/20 0912  Vitals shown include unvalidated device data.  Last Pain:  Vitals:   02/05/20 0908  TempSrc:   PainSc: Asleep         Complications: No apparent anesthesia complications

## 2020-02-05 NOTE — H&P (Signed)
UROLOGY H&P UPDATE  Agree with prior H&P dated 01/09/2020.  Cardiac: RRR Lungs: CTA bilaterally  Laterality: N/A Procedure: Volume study  Informed consent obtained, we specifically discussed the risks of bleeding, infection, post-operative pain, need for additional procedures.  54 year old male with prostate cancer, has been lost to follow-up multiple times before.  Does not tolerate transrectal ultrasound in clinic and here today for volume study in the OR.  Will still need follow-up for brachytherapy, and we discussed this again at length today.  Billey Co, MD 02/05/2020
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
## 2020-02-06 ENCOUNTER — Ambulatory Visit
Admission: RE | Admit: 2020-02-06 | Discharge: 2020-02-06 | Disposition: A | Discharge status: Home / Self Care | Payer: Medicare Other | Source: Ambulatory Visit | Attending: Radiation Oncology | Admitting: Radiation Oncology

## 2020-02-06 VITALS — BP 118/80 | HR 57 | Temp 96.6°F | Wt 158.0 lb

## 2020-02-06 DIAGNOSIS — C61 Malignant neoplasm of prostate: Secondary | ICD-10-CM
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
## 2020-03-05 ENCOUNTER — Other Ambulatory Visit: Payer: Self-pay

## 2020-03-05 ENCOUNTER — Other Ambulatory Visit
Admission: RE | Admit: 2020-03-05 | Discharge: 2020-03-05 | Disposition: A | Discharge status: Home / Self Care | Payer: Medicare Other | Source: Ambulatory Visit | Attending: Urology | Admitting: Urology

## 2020-03-05 DIAGNOSIS — Z01812 Encounter for preprocedural laboratory examination: Secondary | ICD-10-CM | POA: Diagnosis present

## 2020-03-05 DIAGNOSIS — Z20822 Contact with and (suspected) exposure to covid-19: Secondary | ICD-10-CM | POA: Diagnosis not present

## 2020-03-05 LAB — SARS CORONAVIRUS 2 (TAT 6-24 HRS): SARS Coronavirus 2: NEGATIVE

## 2020-03-07 ENCOUNTER — Ambulatory Visit: Payer: Medicare Other | Admitting: Certified Registered Nurse Anesthetist

## 2020-03-07 ENCOUNTER — Ambulatory Visit: Payer: Medicare Other

## 2020-03-07 ENCOUNTER — Other Ambulatory Visit: Payer: Self-pay

## 2020-03-07 ENCOUNTER — Ambulatory Visit: Payer: Medicare Other | Admitting: Radiation Oncology

## 2020-03-07 ENCOUNTER — Ambulatory Visit
Admission: RE | Admit: 2020-03-07 | Discharge: 2020-03-07 | Disposition: A | Discharge status: Home / Self Care | Payer: Medicare Other | Attending: Urology | Admitting: Urology

## 2020-03-07 ENCOUNTER — Encounter: Payer: Self-pay | Admitting: Urology

## 2020-03-07 ENCOUNTER — Encounter
Admission: RE | Disposition: A | Discharge status: Home / Self Care | Payer: Self-pay | Source: Home / Self Care | Attending: Urology

## 2020-03-07 DIAGNOSIS — R7303 Prediabetes: Secondary | ICD-10-CM | POA: Diagnosis not present

## 2020-03-07 DIAGNOSIS — F17210 Nicotine dependence, cigarettes, uncomplicated: Secondary | ICD-10-CM | POA: Insufficient documentation

## 2020-03-07 DIAGNOSIS — C61 Malignant neoplasm of prostate: Secondary | ICD-10-CM | POA: Insufficient documentation

## 2020-03-07 DIAGNOSIS — I252 Old myocardial infarction: Secondary | ICD-10-CM | POA: Diagnosis not present

## 2020-03-07 DIAGNOSIS — F1220 Cannabis dependence, uncomplicated: Secondary | ICD-10-CM | POA: Insufficient documentation

## 2020-03-07 DIAGNOSIS — I10 Essential (primary) hypertension: Secondary | ICD-10-CM | POA: Diagnosis not present

## 2020-03-07 DIAGNOSIS — F1010 Alcohol abuse, uncomplicated: Secondary | ICD-10-CM | POA: Diagnosis not present

## 2020-03-07 HISTORY — PX: RADIOACTIVE SEED IMPLANT: SHX5150

## 2020-03-07 LAB — URINE DRUG SCREEN, QUALITATIVE (ARMC ONLY)
Amphetamines, Ur Screen: NOT DETECTED
Barbiturates, Ur Screen: NOT DETECTED
Benzodiazepine, Ur Scrn: NOT DETECTED
Cannabinoid 50 Ng, Ur ~~LOC~~: POSITIVE — AB
Cocaine Metabolite,Ur ~~LOC~~: NOT DETECTED
MDMA (Ecstasy)Ur Screen: NOT DETECTED
Methadone Scn, Ur: NOT DETECTED
Opiate, Ur Screen: NOT DETECTED
Phencyclidine (PCP) Ur S: NOT DETECTED
Tricyclic, Ur Screen: NOT DETECTED

## 2020-03-07 SURGERY — INSERTION, RADIATION SOURCE, PROSTATE
Anesthesia: General

## 2020-03-07 MED ORDER — TAMSULOSIN HCL 0.4 MG PO CAPS
0.4000 mg | ORAL_CAPSULE | Freq: Every day | ORAL | 0 refills | Status: DC
Start: 2020-03-07 — End: 2020-07-24

## 2020-03-07 MED ORDER — SUGAMMADEX SODIUM 500 MG/5ML IV SOLN
INTRAVENOUS | Status: DC | PRN
  Administered 2020-03-07: 200 mg via INTRAVENOUS

## 2020-03-07 MED ORDER — ROCURONIUM BROMIDE 100 MG/10ML IV SOLN
INTRAVENOUS | Status: DC | PRN
  Administered 2020-03-07: 20 mg via INTRAVENOUS
  Administered 2020-03-07: 40 mg via INTRAVENOUS

## 2020-03-07 MED ORDER — CIPROFLOXACIN HCL 500 MG PO TABS
500.0000 mg | ORAL_TABLET | Freq: Two times a day (BID) | ORAL | 0 refills | Status: AC
Start: 2020-03-07 — End: 2020-03-10

## 2020-03-07 MED ORDER — ONDANSETRON HCL 4 MG/2ML IJ SOLN
INTRAMUSCULAR | Status: DC | PRN
  Administered 2020-03-07: 4 mg via INTRAVENOUS

## 2020-03-07 MED ORDER — OXYCODONE HCL 5 MG/5ML PO SOLN: 5.0000 mg | Freq: Once | ORAL | Status: AC | PRN

## 2020-03-07 MED ORDER — HYDROCODONE-ACETAMINOPHEN 5-325 MG PO TABS: 1.0000 | ORAL_TABLET | ORAL | 0 refills | Status: AC | PRN

## 2020-03-07 MED ORDER — CIPROFLOXACIN IN D5W 400 MG/200ML IV SOLN
INTRAVENOUS | Status: AC
  Filled 2020-03-07: qty 200

## 2020-03-07 MED ORDER — FENTANYL CITRATE (PF) 100 MCG/2ML IJ SOLN
INTRAMUSCULAR | Status: DC | PRN
  Administered 2020-03-07 (×2): 50 ug via INTRAVENOUS

## 2020-03-07 MED ORDER — FLEET ENEMA 7-19 GM/118ML RE ENEM
1.0000 | ENEMA | Freq: Once | RECTAL | Status: AC
  Administered 2020-03-07: 1 via RECTAL

## 2020-03-07 MED ORDER — CHLORHEXIDINE GLUCONATE 0.12 % MT SOLN
15.0000 mL | Freq: Once | OROMUCOSAL | Status: AC
  Administered 2020-03-07: 15 mL via OROMUCOSAL

## 2020-03-07 MED ORDER — PROPOFOL 10 MG/ML IV BOLUS
INTRAVENOUS | Status: DC | PRN
  Administered 2020-03-07: 150 mg via INTRAVENOUS

## 2020-03-07 MED ORDER — OXYCODONE HCL 5 MG PO TABS
ORAL_TABLET | ORAL | Status: AC
  Filled 2020-03-07: qty 1

## 2020-03-07 MED ORDER — ORAL CARE MOUTH RINSE: 15.0000 mL | Freq: Once | OROMUCOSAL | Status: AC

## 2020-03-07 MED ORDER — PROPOFOL 10 MG/ML IV BOLUS
INTRAVENOUS | Status: AC
  Filled 2020-03-07: qty 40

## 2020-03-07 MED ORDER — BACITRACIN ZINC 500 UNIT/GM EX OINT
TOPICAL_OINTMENT | CUTANEOUS | Status: AC
  Filled 2020-03-07: qty 28.35

## 2020-03-07 MED ORDER — DEXAMETHASONE SODIUM PHOSPHATE 10 MG/ML IJ SOLN
INTRAMUSCULAR | Status: DC | PRN
  Administered 2020-03-07: 5 mg via INTRAVENOUS

## 2020-03-07 MED ORDER — ONDANSETRON HCL 4 MG/2ML IJ SOLN: 4.0000 mg | Freq: Once | INTRAMUSCULAR | Status: DC | PRN

## 2020-03-07 MED ORDER — FENTANYL CITRATE (PF) 100 MCG/2ML IJ SOLN
INTRAMUSCULAR | Status: AC
  Administered 2020-03-07: 25 ug via INTRAVENOUS
  Filled 2020-03-07: qty 2

## 2020-03-07 MED ORDER — CHLORHEXIDINE GLUCONATE 0.12 % MT SOLN
OROMUCOSAL | Status: AC
  Filled 2020-03-07: qty 15

## 2020-03-07 MED ORDER — OXYCODONE HCL 5 MG PO TABS
5.0000 mg | ORAL_TABLET | Freq: Once | ORAL | Status: AC | PRN
  Administered 2020-03-07: 5 mg via ORAL

## 2020-03-07 MED ORDER — ALBUTEROL SULFATE HFA 108 (90 BASE) MCG/ACT IN AERS
INHALATION_SPRAY | RESPIRATORY_TRACT | Status: AC
  Filled 2020-03-07: qty 6.7

## 2020-03-07 MED ORDER — DEXMEDETOMIDINE HCL IN NACL 80 MCG/20ML IV SOLN
INTRAVENOUS | Status: AC
  Filled 2020-03-07: qty 20

## 2020-03-07 MED ORDER — LIDOCAINE HCL (CARDIAC) PF 100 MG/5ML IV SOSY
PREFILLED_SYRINGE | INTRAVENOUS | Status: DC | PRN
  Administered 2020-03-07: 100 mg via INTRAVENOUS

## 2020-03-07 MED ORDER — FENTANYL CITRATE (PF) 100 MCG/2ML IJ SOLN
25.0000 ug | INTRAMUSCULAR | Status: DC | PRN
  Administered 2020-03-07 (×3): 25 ug via INTRAVENOUS

## 2020-03-07 MED ORDER — CIPROFLOXACIN IN D5W 400 MG/200ML IV SOLN
400.0000 mg | Freq: Once | INTRAVENOUS | Status: AC
  Administered 2020-03-07: 400 mg via INTRAVENOUS

## 2020-03-07 MED ORDER — MIDAZOLAM HCL 2 MG/2ML IJ SOLN
INTRAMUSCULAR | Status: DC | PRN
  Administered 2020-03-07: 2 mg via INTRAVENOUS

## 2020-03-07 MED ORDER — ALBUTEROL SULFATE HFA 108 (90 BASE) MCG/ACT IN AERS
INHALATION_SPRAY | RESPIRATORY_TRACT | Status: DC | PRN
  Administered 2020-03-07: 4 via RESPIRATORY_TRACT

## 2020-03-07 MED ORDER — LACTATED RINGERS IV SOLN: INTRAVENOUS | Status: DC

## 2020-03-07 MED ORDER — DEXMEDETOMIDINE HCL IN NACL 200 MCG/50ML IV SOLN
INTRAVENOUS | Status: DC | PRN
  Administered 2020-03-07: 8 ug via INTRAVENOUS
  Administered 2020-03-07: 4 ug via INTRAVENOUS

## 2020-03-07 MED ORDER — MIDAZOLAM HCL 2 MG/2ML IJ SOLN
INTRAMUSCULAR | Status: AC
  Filled 2020-03-07: qty 2

## 2020-03-07 MED ORDER — FENTANYL CITRATE (PF) 100 MCG/2ML IJ SOLN
INTRAMUSCULAR | Status: AC
  Filled 2020-03-07: qty 2

## 2020-03-07 SURGICAL SUPPLY — 24 items
BAG URINE DRAIN 2000ML AR STRL (UROLOGICAL SUPPLIES) ×3 IMPLANT
BLADE CLIPPER SURG (BLADE) ×3 IMPLANT
BRUSH SCRUB EZ 1% IODOPHOR (MISCELLANEOUS) ×3 IMPLANT
CATH FOL 2WAY LX 16X5 (CATHETERS) ×3 IMPLANT
COVER BACK TABLE REUSABLE LG (DRAPES) ×3 IMPLANT
DRAPE INCISE 23X17 IOBAN STRL (DRAPES) ×2
DRAPE INCISE IOBAN 23X17 STRL (DRAPES) ×1 IMPLANT
DRAPE UNDER BUTTOCK W/FLU (DRAPES) ×3 IMPLANT
DRSG TELFA 3X8 NADH (GAUZE/BANDAGES/DRESSINGS) ×3 IMPLANT
GLOVE BIO SURGEON STRL SZ7.5 (GLOVE) ×6 IMPLANT
GLOVE BIOGEL PI IND STRL 7.5 (GLOVE) ×2 IMPLANT
GLOVE BIOGEL PI INDICATOR 7.5 (GLOVE) ×4
GOWN STRL REUS W/ TWL LRG LVL3 (GOWN DISPOSABLE) ×1 IMPLANT
GOWN STRL REUS W/ TWL XL LVL3 (GOWN DISPOSABLE) ×2 IMPLANT
GOWN STRL REUS W/TWL LRG LVL3 (GOWN DISPOSABLE) ×2
GOWN STRL REUS W/TWL XL LVL3 (GOWN DISPOSABLE) ×4
IV NS 1000ML (IV SOLUTION) ×2
IV NS 1000ML BAXH (IV SOLUTION) ×1 IMPLANT
KIT TURNOVER CYSTO (KITS) ×3 IMPLANT
PACK CYSTO AR (MISCELLANEOUS) ×3 IMPLANT
SET CYSTO W/LG BORE CLAMP LF (SET/KITS/TRAYS/PACK) ×3 IMPLANT
SURGILUBE 2OZ TUBE FLIPTOP (MISCELLANEOUS) ×3 IMPLANT
SYR 10ML LL (SYRINGE) ×3 IMPLANT
WATER STERILE IRR 1000ML POUR (IV SOLUTION) ×3 IMPLANT

## 2020-03-07 NOTE — Discharge Instructions (Signed)

## 2020-03-07 NOTE — H&P (Signed)
UROLOGY H&P UPDATE  Agree with prior H&P dated 02/06/20 by Dr. Baruch Gouty. He is a 54 y.o. man with low risk prostate cancer.  We again discussed that we typically would manage these patients with active surveillance, however with his concerning MRI findings(PIRADS-4 lesion with bulging at capsule), high PSA density, strong family history of lethal prostate cancer at a young age in multiple family members, and inability to tolerate prostate biopsy in clinic, he is a much better candidate for definitive management. He has opted for brachytherapy.  Cardiac: RRR Lungs: CTA bilaterally  Laterality: N/A Procedure: Brachytherapy seed implant  Informed consent obtained, we specifically discussed the risks of bleeding, infection, post-operative pain, need for additional procedures, retention, urinary symptoms, ED, hematuria, risk of recurrence, and need for PSA monitoring in the future.  Billey Co, MD 03/07/2020
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
## 2020-03-07 NOTE — Op Note (Signed)
Preoperative diagnosis: Adenocarcinoma of the prostate    Postoperative diagnosis: Same    Procedure: I-125 prostate seed implantation, cystoscopy   Surgeon: Nickolas Madrid, MD   Radiation Oncologist: [name], M.D.    Anesthesia: General   Drains: none   Complications: none   Indications: Prostate cancer   Procedure: The patient was brought to operating suite and placement table in the supine position. At this time, a universal timeout protocol was performed, all team members were identified, Venodyne boots are placed, and he was administered IV cipro in the preoperative period. He was placed in lithotomy position and prepped and draped in usual fashion. The radiation oncology department placed a transrectal ultrasound probe anchoring stand/ grid and aligned with previous imaging from the volume study. Foley catheter was inserted without difficulty.  All needle passage was done with real-time transrectal ultrasound guidance in both the transverse and sagittal plains in order to achieve the desired preplanned position. A total of 22 needles were placed.  66 active seeds were implanted. The Foley catheter was removed and a rigid cystoscopy failed to show any seeds outside the prostate without evidence of trauma to the urethral, prostatic fossa, or bladder. Small blood clot evacuated from bladder.  The bladder was drained.  A fluoroscopic image was then obtained showing excellent distrubution of the brachytherapy seeds.  Each seed was counted and counts were correct.     The patient was then repositioned in the supine position, reversed from anesthesia, and taken to the PACU in stable condition.  Nickolas Madrid, MD 03/07/2020

## 2020-03-07 NOTE — Anesthesia Procedure Notes (Signed)
Procedure Name: Intubation Date/Time: 03/07/2020 7:43 AM Performed by: Louann Sjogren, CRNA Pre-anesthesia Checklist: Patient identified, Patient being monitored, Timeout performed, Emergency Drugs available and Suction available Patient Re-evaluated:Patient Re-evaluated prior to induction Oxygen Delivery Method: Circle system utilized Preoxygenation: Pre-oxygenation with 100% oxygen Induction Type: IV induction Ventilation: Mask ventilation without difficulty Laryngoscope Size: 3 and McGraph Grade View: Grade I Tube type: Oral Tube size: 7.0 mm Number of attempts: 1 Airway Equipment and Method: Stylet Placement Confirmation: ETT inserted through vocal cords under direct vision,  positive ETCO2 and breath sounds checked- equal and bilateral Secured at: 21 cm Tube secured with: Tape Dental Injury: Teeth and Oropharynx as per pre-operative assessment

## 2020-03-07 NOTE — Progress Notes (Signed)
Radiation Oncology I-125 interstitial implant note   Name: Jonathan Garza   Date:   11/22/2019 MRN:  [id number] DOB: [birth year]    This 54 y.o. male presents to the hospital today for I-125 interstitial implant for a Gleason 6 (3+3) adenocarcinoma presenting with a PSA of 6.9.    REFERRING PROVIDER: No ref. provider found  HPI: Patient is a 54 year old male who presented with an elevated PSA of 6.9 was found to have Gleason 6 (3+3) adenocarcinoma the prostate he had a volume study in anticipation of I-125 interstitial implant which was performed today..  COMPLICATIONS OF TREATMENT: none  FOLLOW UP COMPLIANCE: keeps appointments   PHYSICAL EXAM:  BP (!) 144/90   Pulse 61   Temp 97.7 F (36.5 C) (Temporal)   Resp 18   Ht 5\' 5"  (1.651 m)   Wt 150 lb (68 kg)   SpO2 98%   BMI 24.96 kg/m  Well-developed well-nourished patient in NAD. HEENT reveals PERLA, EOMI, discs not visualized.  Oral cavity is clear. No oral mucosal lesions are identified. Neck is clear without evidence of cervical or supraclavicular adenopathy. Lungs are clear to A&P. Cardiac examination is essentially unremarkable with regular rate and rhythm without murmur rub or thrill. Abdomen is benign with no organomegaly or masses noted. Motor sensory and DTR levels are equal and symmetric in the upper and lower extremities. Cranial nerves II through XII are grossly intact. Proprioception is intact. No peripheral adenopathy or edema is identified. No motor or sensory levels are noted. Crude visual fields are within normal range.  RADIOLOGY RESULTS: Ultrasound used for source placement  PLAN: Patient was taken to the operating room and general anesthesia was administered. Legs were immobilized in stirrups and patient was positioned in the exact same proportions as original volume study. Patient was prepped and Foley catheter was placed. Ultrasound guidance identified the prostate and recreated the original set up as per treatment  planning volume study. 22 needles were placed under ultrasound guidance with PVCs delivered to the prostate volume. After completion of procedure cystoscopy was performed by urology and no evidence of seeds in the bladder were noted. Patient tolerated the procedure extremely well. Initial plain film as doublecheck identified 66seeds in the prostate. Patient has followup appointment in one month for CT scan for quality assurance will be performed.    [name], MD

## 2020-03-07 NOTE — Transfer of Care (Signed)
Immediate Anesthesia Transfer of Care Note  Patient: Jonathan Garza  Procedure(s) Performed: RADIOACTIVE SEED IMPLANT/BRACHYTHERAPY IMPLANT (N/A )  Patient Location: PACU  Anesthesia Type:General  Level of Consciousness: awake and alert   Airway & Oxygen Therapy: Patient connected to face mask oxygen  Post-op Assessment: Report given to RN and Post -op Vital signs reviewed and stable  Post vital signs: Reviewed and stable  Last Vitals:  Vitals Value Taken Time  BP 155/100 03/07/20 0843  Temp 36 C 03/07/20 0839  Pulse 64 03/07/20 0843  Resp 22 03/07/20 0843  SpO2 100 % 03/07/20 0843  Vitals shown include unvalidated device data.  Last Pain:  Vitals:   03/07/20 0839  TempSrc:   PainSc: 0-No pain         Complications: No complications documented.

## 2020-03-08 ENCOUNTER — Encounter: Payer: Self-pay | Admitting: Urology

## 2020-03-08 NOTE — Anesthesia Postprocedure Evaluation (Signed)
Anesthesia Post Note  Patient: Chief Operating Officer  Procedure(s) Performed: RADIOACTIVE SEED IMPLANT/BRACHYTHERAPY IMPLANT (N/A )  Patient location during evaluation: PACU Anesthesia Type: General Level of consciousness: awake and alert Pain management: pain level controlled Vital Signs Assessment: post-procedure vital signs reviewed and stable Respiratory status: spontaneous breathing, nonlabored ventilation and respiratory function stable Cardiovascular status: blood pressure returned to baseline and stable Postop Assessment: no apparent nausea or vomiting Anesthetic complications: no   No complications documented.   Last Vitals:  Vitals:   03/07/20 0924 03/07/20 1014  BP: (!) 156/94 (!) 144/90  Pulse: (!) 50 61  Resp: 18 18  Temp: 36.5 C   SpO2: 98% 98%    Last Pain:  Vitals:   03/08/20 0901  TempSrc:   PainSc: [place]

## 2020-04-02 ENCOUNTER — Other Ambulatory Visit: Payer: Self-pay

## 2020-04-02 ENCOUNTER — Ambulatory Visit
Admission: RE | Admit: 2020-04-02 | Discharge: 2020-04-02 | Disposition: A | Discharge status: Home / Self Care | Payer: Medicare Other | Source: Ambulatory Visit | Attending: Radiation Oncology | Admitting: Radiation Oncology

## 2020-04-02 ENCOUNTER — Other Ambulatory Visit: Payer: Self-pay | Admitting: *Deleted

## 2020-04-02 VITALS — BP 128/90 | HR 52 | Temp 96.2°F | Resp 16 | Wt 152.7 lb

## 2020-04-02 DIAGNOSIS — C61 Malignant neoplasm of prostate: Secondary | ICD-10-CM | POA: Insufficient documentation

## 2020-04-02 DIAGNOSIS — Z923 Personal history of irradiation: Secondary | ICD-10-CM | POA: Insufficient documentation

## 2020-04-02 NOTE — Progress Notes (Signed)
Radiation Oncology Follow up Note  Name: Jonathan Garza   Date:   04/02/2020 MRN:  [id number] DOB: [birth year]    This 54 y.o. male presents to the clinic today for 1 month follow-up status post I-125 interstitial implant for Gleason 6 (3+3) adenocarcinoma presenting with a PSA of 6.9..  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  He specifically denies any increased lower urinary tract symptoms diarrhea or fatigue.  I have run a CT scan for quality assurance showing excellent placement of sources..  COMPLICATIONS OF TREATMENT: none  FOLLOW UP COMPLIANCE: keeps appointments   PHYSICAL EXAM:  BP 128/90   Pulse (!) 52   Temp (!) 96.2 F (35.7 C) (Tympanic)   Resp 16   Wt 152 lb 11.2 oz (69.3 kg)   BMI 25.41 kg/m  Well-developed well-nourished patient in NAD. HEENT reveals PERLA, EOMI, discs not visualized.  Oral cavity is clear. No oral mucosal lesions are identified. Neck is clear without evidence of cervical or supraclavicular adenopathy. Lungs are clear to A&P. Cardiac examination is essentially unremarkable with regular rate and rhythm without murmur rub or thrill. Abdomen is benign with no organomegaly or masses noted. Motor sensory and DTR levels are equal and symmetric in the upper and lower extremities. Cranial nerves II through XII are grossly intact. Proprioception is intact. No peripheral adenopathy or edema is identified. No motor or sensory levels are noted. Crude visual fields are within normal range.  RADIOLOGY RESULTS: CT scan for quality assurance reviewed  PLAN: Present time patient is doing well very low side effect profile from I-125 interstitial implant.  I have explained her PSA protocol I have asked to see him back in 3 to 4 months with a PSA prior to his visit.  Patient  knows to call sooner with any concerns.3  I would like to take this opportunity to thank you for allowing me to participate in the care of your patient.[name], MD

## 2020-04-03 DIAGNOSIS — C61 Malignant neoplasm of prostate: Secondary | ICD-10-CM | POA: Diagnosis not present

## 2020-04-04 DIAGNOSIS — C61 Malignant neoplasm of prostate: Secondary | ICD-10-CM | POA: Diagnosis not present

## 2020-07-10 ENCOUNTER — Ambulatory Visit: Payer: Medicare Other | Admitting: Radiation Oncology

## 2020-07-19 ENCOUNTER — Other Ambulatory Visit: Payer: Self-pay

## 2020-07-22 ENCOUNTER — Other Ambulatory Visit: Payer: Self-pay

## 2020-07-22 ENCOUNTER — Other Ambulatory Visit: Payer: Medicare Other

## 2020-07-22 DIAGNOSIS — C61 Malignant neoplasm of prostate: Secondary | ICD-10-CM

## 2020-07-23 LAB — PSA: Prostate Specific Ag, Serum: 2.1 ng/mL (ref 0.0–4.0)

## 2020-07-24 ENCOUNTER — Other Ambulatory Visit: Payer: Self-pay | Admitting: *Deleted

## 2020-07-24 ENCOUNTER — Ambulatory Visit
Admission: RE | Admit: 2020-07-24 | Discharge: 2020-07-24 | Disposition: A | Discharge status: Home / Self Care | Payer: Medicare Other | Source: Ambulatory Visit | Attending: Radiation Oncology | Admitting: Radiation Oncology

## 2020-07-24 ENCOUNTER — Encounter: Payer: Self-pay | Admitting: Urology

## 2020-07-24 ENCOUNTER — Encounter: Payer: Self-pay | Admitting: Radiation Oncology

## 2020-07-24 ENCOUNTER — Other Ambulatory Visit: Payer: Self-pay

## 2020-07-24 ENCOUNTER — Ambulatory Visit (INDEPENDENT_AMBULATORY_CARE_PROVIDER_SITE_OTHER): Payer: Medicare Other | Admitting: Urology

## 2020-07-24 VITALS — BP 129/83 | HR 57 | Temp 97.2°F | Wt 165.3 lb

## 2020-07-24 VITALS — BP 124/82 | HR 57 | Ht 65.0 in | Wt 165.4 lb

## 2020-07-24 DIAGNOSIS — C61 Malignant neoplasm of prostate: Secondary | ICD-10-CM

## 2020-07-24 DIAGNOSIS — R972 Elevated prostate specific antigen [PSA]: Secondary | ICD-10-CM

## 2020-07-24 DIAGNOSIS — N529 Male erectile dysfunction, unspecified: Secondary | ICD-10-CM | POA: Diagnosis not present

## 2020-07-24 MED ORDER — SILDENAFIL CITRATE 20 MG PO TABS: 20.0000 mg | ORAL_TABLET | ORAL | 11 refills | Status: DC | PRN

## 2020-07-24 MED ORDER — TAMSULOSIN HCL 0.4 MG PO CAPS: 0.4000 mg | ORAL_CAPSULE | Freq: Every day | ORAL | 0 refills | Status: DC

## 2020-07-24 NOTE — Patient Instructions (Signed)
Sildenafil tablets (Erectile Dysfunction) What is this medicine? SILDENAFIL (sil DEN a fil) is used to treat erection problems in men. This medicine may be used for other purposes; ask your health care provider or pharmacist if you have questions. COMMON BRAND NAME(S): Viagra What should I tell my health care provider before I take this medicine? They need to know if you have any of these conditions:  bleeding disorders  eye or vision problems, including a rare inherited eye disease called retinitis pigmentosa  anatomical deformation of the penis, Peyronie's disease, or history of priapism (painful and prolonged erection)  heart disease, angina, a history of heart attack, irregular heart beats, or other heart problems  high or low blood pressure  history of blood diseases, like sickle cell anemia or leukemia  history of stomach bleeding  kidney disease  liver disease  stroke  an unusual or allergic reaction to sildenafil, other medicines, foods, dyes, or preservatives  pregnant or trying to get pregnant  breast-feeding How should I use this medicine? Take this medicine by mouth with a glass of water. Follow the directions on the prescription label. The dose is usually taken 1 hour before sexual activity. You should not take the dose more than once per day. Do not take your medicine more often than directed. Talk to your pediatrician regarding the use of this medicine in children. This medicine is not used in children for this condition. Overdosage: If you think you have taken too much of this medicine contact a poison control center or emergency room at once. NOTE: This medicine is only for you. Do not share this medicine with others. What if I miss a dose? This does not apply. Do not take double or extra doses. What may interact with this medicine? Do not take this medicine with any of the following medications:  cisapride  nitrates like amyl nitrite, isosorbide  dinitrate, isosorbide mononitrate, nitroglycerin  riociguat This medicine may also interact with the following medications:  antiviral medicines for HIV or AIDS  bosentan  certain medicines for benign prostatic hyperplasia (BPH)  certain medicines for blood pressure  certain medicines for fungal infections like ketoconazole and itraconazole  cimetidine  erythromycin  rifampin This list may not describe all possible interactions. Give your health care provider a list of all the medicines, herbs, non-prescription drugs, or dietary supplements you use. Also tell them if you smoke, drink alcohol, or use illegal drugs. Some items may interact with your medicine. What should I watch for while using this medicine? If you notice any changes in your vision while taking this drug, call your doctor or health care professional as soon as possible. Stop using this medicine and call your health care provider right away if you have a loss of sight in one or both eyes. Contact your doctor or health care professional right away if you have an erection that lasts longer than 4 hours or if it becomes painful. This may be a sign of a serious problem and must be treated right away to prevent permanent damage. If you experience symptoms of nausea, dizziness, chest pain or arm pain upon initiation of sexual activity after taking this medicine, you should refrain from further activity and call your doctor or health care professional as soon as possible. Do not drink alcohol to excess (examples, 5 glasses of wine or 5 shots of whiskey) when taking this medicine. When taken in excess, alcohol can increase your chances of getting a headache or getting dizzy, increasing  your heart rate or lowering your blood pressure. Using this medicine does not protect you or your partner against HIV infection (the virus that causes AIDS) or other sexually transmitted diseases. What side effects may I notice from receiving this  medicine? Side effects that you should report to your doctor or health care professional as soon as possible:  allergic reactions like skin rash, itching or hives, swelling of the face, lips, or tongue  breathing problems  changes in hearing  changes in vision  chest pain  fast, irregular heartbeat  prolonged or painful erection  seizures Side effects that usually do not require medical attention (report to your doctor or health care professional if they continue or are bothersome):  back pain  dizziness  flushing  headache  indigestion  muscle aches  nausea  stuffy or runny nose This list may not describe all possible side effects. Call your doctor for medical advice about side effects. You may report side effects to FDA at 1-800-FDA-1088. Where should I keep my medicine? Keep out of reach of children. Store at room temperature between 15 and 30 degrees C (59 and 86 degrees F). Throw away any unused medicine after the expiration date. NOTE: This sheet is a summary. It may not cover all possible information. If you have questions about this medicine, talk to your doctor, pharmacist, or health care provider.  2020 Elsevier/Gold Standard (2015-07-31 12:00:25)

## 2020-07-24 NOTE — Addendum Note (Signed)
Encounter addended by: [name], MD on: 07/24/2020 10:11 AM  Actions taken: Level of Service modified

## 2020-07-24 NOTE — Progress Notes (Signed)
Radiation Oncology Follow up Note  Name: Jonathan Garza   Date:   07/24/2020 MRN:  [id number] DOB: April 08, 1966    This 54 y.o. male presents to the clinic today for 56-month follow-up status post I-125 interstitial implant for Gleason 6 (3+3) adenocarcinoma prostate presenting with a PSA of 6.9.  REFERRING PROVIDER: [facility]*  HPI: Patient is a.  54 year old male now out 4 months having completed a I-125 interstitial implant for stage IIa Gleason 6 adenocarcinoma.  Seen today in routine follow-up he states he is having nocturia x4 no significant urgency or frequency.  He is also having having some erectile dysfunction.  He specifically denies any increased GI symptoms.  Most recent PSA has dropped to 2.1 from 6.9 a year ago  COMPLICATIONS OF TREATMENT: none  FOLLOW UP COMPLIANCE: keeps appointments   PHYSICAL EXAM:  BP 129/83 (BP Location: Left Arm, Patient Position: Sitting, Cuff Size: Normal)   Pulse (!) 57   Temp (!) 97.2 F (36.2 C) (Tympanic)   Wt 165 lb 4.8 oz (75 kg)   BMI 27.51 kg/m  Well-developed well-nourished patient in NAD. HEENT reveals PERLA, EOMI, discs not visualized.  Oral cavity is clear. No oral mucosal lesions are identified. Neck is clear without evidence of cervical or supraclavicular adenopathy. Lungs are clear to A&P. Cardiac examination is essentially unremarkable with regular rate and rhythm without murmur rub or thrill. Abdomen is benign with no organomegaly or masses noted. Motor sensory and DTR levels are equal and symmetric in the upper and lower extremities. Cranial nerves II through XII are grossly intact. Proprioception is intact. No peripheral adenopathy or edema is identified. No motor or sensory levels are noted. Crude visual fields are within normal range.  RADIOLOGY RESULTS: No current films to review  PLAN: Present time patient is doing well has had a decent response biochemically to his I-125 interstitial implant.  I have assured him  over time this will continue to decrease.  I asked him to restart and have prescribed Flomax for the patient.  I have also put in a prescription for generic Viagra.  I have asked to see him back in 6 months for follow-up with a PSA prior to that visit.  Patient continues close follow-up care with urology.  Patient knows to call with any concerns.  I would like to take this opportunity to thank you for allowing me to participate in the care of your patient.[name], MD

## 2020-07-24 NOTE — Progress Notes (Signed)
   07/24/2020 8:40 AM   Jonathan Garza 09-04-1965 [id number]  Reason for visit: Follow up prostate cancer, urinary symptoms, ED  HPI: Jonathan Garza is a 54 year old male with a strong family history of lethal prostate cancer who was diagnosed with low risk prostate cancer, and opted for brachytherapy in the setting of a suspicious PI-RADS 4 lesion on prostate MRI with bulging at the capsule.  He underwent brachytherapy implant with myself and Dr. Baruch Gouty on 03/07/2020.  Preoperative PSA was 6.9.  Most recent PSA on 07/22/2020 has decreased appropriately to 2.1.  He is having some mild urinary symptoms of frequency during the day and nocturia.  He is not having any incontinence.  He drinks 4-5 beers before bed, which is likely the primary reason he has nocturia 4-5 times.  We reviewed behavioral strategies regarding his urinary symptoms.  He has had some trouble with erections since brachytherapy.  He has not tried any medications for this.  He is interested in sildenafil.  We discussed the risks and benefits of sildenafil at length.  Trial of sildenafil 20 to 100 mg on demand RTC 6 months with PSA prior   Billey Co, MD  [facility] [address] [place], [place] [address] [phone]

## 2020-12-30 ENCOUNTER — Other Ambulatory Visit: Payer: Self-pay

## 2020-12-30 ENCOUNTER — Other Ambulatory Visit: Payer: Medicare Other

## 2020-12-30 DIAGNOSIS — R972 Elevated prostate specific antigen [PSA]: Secondary | ICD-10-CM

## 2020-12-31 LAB — PSA: Prostate Specific Ag, Serum: 3 ng/mL (ref 0.0–4.0)

## 2021-01-02 ENCOUNTER — Encounter: Payer: Self-pay | Admitting: Urology

## 2021-01-02 ENCOUNTER — Ambulatory Visit (INDEPENDENT_AMBULATORY_CARE_PROVIDER_SITE_OTHER): Payer: Medicare Other | Admitting: Urology

## 2021-01-02 ENCOUNTER — Other Ambulatory Visit: Payer: Self-pay

## 2021-01-02 VITALS — BP 134/87 | HR 65 | Ht 65.0 in | Wt 160.0 lb

## 2021-01-02 DIAGNOSIS — N529 Male erectile dysfunction, unspecified: Secondary | ICD-10-CM

## 2021-01-02 DIAGNOSIS — R972 Elevated prostate specific antigen [PSA]: Secondary | ICD-10-CM

## 2021-01-02 DIAGNOSIS — C61 Malignant neoplasm of prostate: Secondary | ICD-10-CM

## 2021-01-02 NOTE — Progress Notes (Signed)
   01/02/2021 12:40 PM   Jonathan Garza Oct 10, 1965 [id number]  Reason for visit: Follow up prostate cancer, ED  HPI: Jonathan Garza is a 55 year old male with a strong family history of lethal prostate cancer who was diagnosed with low risk prostate cancer, and opted for brachytherapy in the setting of a suspicious PI-RADS 4 lesion on prostate MRI with bulging at the capsule.  He underwent brachytherapy implant with myself and Dr. Baruch Gouty on 03/07/2020.  Preoperative PSA was 6.9.  Most recent PSA on 07/22/2020 was 3.0 from 2.15 months ago.  We discussed the concept of a PSA bump after brachytherapy.  He is not having any significant urinary complaints at this time.  He has significant nocturia 3-5 times per night, but this is secondary to his ongoing consumption of 4-5 beers in the evening.  His frequency during the day has improved, and he is no longer taking Flomax.  Regarding his erectile dysfunction, he takes a 20 mg sildenafil on demand with good results.  Overall he is doing very well.  We discussed the need to continue to closely monitor the PSA, and the importance of the overall trend.  RTC 6 months with PSA prior   Billey Co, MD  [facility] [address] [place], [place] [address] [phone]

## 2021-01-10 ENCOUNTER — Other Ambulatory Visit: Payer: Medicare Other

## 2021-01-20 ENCOUNTER — Other Ambulatory Visit: Payer: Self-pay

## 2021-01-20 ENCOUNTER — Ambulatory Visit
Admission: RE | Admit: 2021-01-20 | Discharge: 2021-01-20 | Disposition: A | Discharge status: Home / Self Care | Payer: Medicare Other | Source: Ambulatory Visit | Attending: Radiation Oncology | Admitting: Radiation Oncology

## 2021-01-20 ENCOUNTER — Encounter: Payer: Self-pay | Admitting: Radiation Oncology

## 2021-01-20 VITALS — BP 135/101 | HR 57 | Temp 96.8°F | Wt 171.6 lb

## 2021-01-20 DIAGNOSIS — C61 Malignant neoplasm of prostate: Secondary | ICD-10-CM | POA: Diagnosis present

## 2021-01-20 DIAGNOSIS — Z923 Personal history of irradiation: Secondary | ICD-10-CM | POA: Diagnosis not present

## 2021-01-20 NOTE — Progress Notes (Signed)
Radiation Oncology Follow up Note  Name: Jonathan Garza   Date:   01/20/2021 MRN:  [id number] DOB: 11-16-1965    This 55 y.o. male presents to the clinic today for 26-month follow-up status post I-125 interstitial implant for Gleason 6 (3+3) adenocarcinoma the prostate presenting with a PSA of 6.9.  REFERRING PROVIDER: [facility]*  HPI: Patient is a 55 year old male now out 10 months having completed I-125 interstitial implant for Gleason 6 adenocarcinoma presenting with a PSA of 6.9.  He is seen today in routine follow-up is doing well specifically denies any increased lower urinary tract symptoms diarrhea or fatigue his most recent PSA is 3.0 up slightly from 6 months ago of 2.1 which may reflect a break brachytherapy bump  COMPLICATIONS OF TREATMENT: none  FOLLOW UP COMPLIANCE: keeps appointments   PHYSICAL EXAM:  BP (!) 135/101   Pulse (!) 57   Temp (!) 96.8 F (36 C) (Tympanic)   Wt 171 lb 9.6 oz (77.8 kg)   BMI 28.56 kg/m  .  Well-developed well-nourished patient in NAD. HEENT reveals PERLA, EOMI, discs not visualized.  Oral cavity is clear. No oral mucosal lesions are identified. Neck is clear without evidence of cervical or supraclavicular adenopathy. Lungs are clear to A&P. Cardiac examination is essentially unremarkable with regular rate and rhythm without murmur rub or thrill. Abdomen is benign with no organomegaly or masses noted. Motor sensory and DTR levels are equal and symmetric in the upper and lower extremities. Cranial nerves II through XII are grossly intact. Proprioception is intact. No peripheral adenopathy or edema is identified. No motor or sensory levels are noted. Crude visual fields are within normal range.  RADIOLOGY RESULTS: No current films to review  PLAN: Present time patient is doing well slight bump in his PSA although repeat that in 6 months.  He continues to be asymptomatic.  I am otherwise pleased with his overall progress.  Patient is to  call with any concerns.  I would like to take this opportunity to thank you for allowing me to participate in the care of your patient.[name], MD

## 2021-03-26 ENCOUNTER — Other Ambulatory Visit: Payer: Self-pay

## 2021-03-26 ENCOUNTER — Ambulatory Visit (INDEPENDENT_AMBULATORY_CARE_PROVIDER_SITE_OTHER): Payer: Medicare Other

## 2021-03-26 ENCOUNTER — Ambulatory Visit
Admission: EM | Admit: 2021-03-26 | Discharge: 2021-03-26 | Disposition: A | Discharge status: Home / Self Care | Payer: Medicare Other | Attending: Family Medicine | Admitting: Family Medicine

## 2021-03-26 DIAGNOSIS — M25562 Pain in left knee: Secondary | ICD-10-CM

## 2021-03-26 DIAGNOSIS — M25561 Pain in right knee: Secondary | ICD-10-CM | POA: Diagnosis not present

## 2021-03-26 MED ORDER — NAPROXEN 500 MG PO TABS: 500.0000 mg | ORAL_TABLET | Freq: Two times a day (BID) | ORAL | 0 refills | Status: DC

## 2021-03-26 NOTE — Discharge Instructions (Signed)
-  Naproxen: 1 tablet every 12 hours for pain -May also use Tylenol for pain.  Do not add ibuprofen while taking the naproxen -No obvious injuries on x-ray.  There could be early signs of arthritis but no loss of joint space. -Rest knee as able until better -Follow-up with primary care orthopedics as needed should your symptoms continue.
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
## 2021-03-26 NOTE — ED Triage Notes (Signed)
Pt c/o left knee pain that started last week no known injury

## 2021-07-03 ENCOUNTER — Other Ambulatory Visit: Payer: Self-pay

## 2021-07-03 ENCOUNTER — Other Ambulatory Visit: Payer: Medicare Other

## 2021-07-03 DIAGNOSIS — R972 Elevated prostate specific antigen [PSA]: Secondary | ICD-10-CM

## 2021-07-04 LAB — PSA: Prostate Specific Ag, Serum: 3.8 ng/mL (ref 0.0–4.0)

## 2021-07-09 ENCOUNTER — Ambulatory Visit (INDEPENDENT_AMBULATORY_CARE_PROVIDER_SITE_OTHER): Payer: Medicare Other | Admitting: Urology

## 2021-07-09 ENCOUNTER — Encounter: Payer: Self-pay | Admitting: Urology

## 2021-07-09 ENCOUNTER — Other Ambulatory Visit: Payer: Self-pay

## 2021-07-09 VITALS — BP 136/90 | HR 56 | Ht 65.0 in | Wt 185.0 lb

## 2021-07-09 DIAGNOSIS — N529 Male erectile dysfunction, unspecified: Secondary | ICD-10-CM | POA: Diagnosis not present

## 2021-07-09 DIAGNOSIS — C61 Malignant neoplasm of prostate: Secondary | ICD-10-CM | POA: Diagnosis not present

## 2021-07-09 NOTE — Progress Notes (Signed)
   07/09/2021 9:39 AM   Jonathan Garza October 21, 1965 [id number]  Reason for visit: Follow up prostate cancer, ED   HPI: [name] is a 55 year old male with a strong family history of lethal prostate cancer who was diagnosed with low risk prostate cancer, and opted for brachytherapy in the setting of a suspicious PI-RADS 4 lesion on prostate MRI with bulging at the capsule.  He underwent brachytherapy implant with myself and Dr. Baruch Gouty on 03/07/2020.  Preoperative PSA was 6.9, and PSA 3 months postoperatively was 2.1.   Most recent PSA on 07/22/2020 was 3.8 from 3 months ago.  We discussed the concept of a PSA bump after brachytherapy, and the importance of monitoring this PSA trend closely moving forward, especially with his PI-RADS 4 lesion on MRI and strong family history of lethal prostate cancer.  We discussed the possible need for evaluation by oncology or potentially further imaging like PSMA scan pending PSA trend.   He is not having any significant urinary complaints at this time.  He has significant nocturia 3-5 times per night, but this is secondary to his ongoing consumption of 4-5 beers in the evening.  He is no longer taking Flomax.   Regarding his erectile dysfunction, he takes a 20 mg sildenafil on demand with good results.   -Overall he is doing very well.  We discussed the need to continue to closely monitor the PSA, and the importance of the overall trend.   -Lab visit 4 months PSA, call with results, if continues to rise may need to consider repeat imaging or referral to oncology  Billey Co, [facility] [place] [address] [place], [place] [address] [phone]

## 2021-07-28 ENCOUNTER — Other Ambulatory Visit: Payer: Self-pay | Admitting: Urology

## 2021-07-28 ENCOUNTER — Encounter: Payer: Self-pay | Admitting: Radiation Oncology

## 2021-07-28 ENCOUNTER — Other Ambulatory Visit: Payer: Self-pay

## 2021-07-28 ENCOUNTER — Ambulatory Visit
Admission: RE | Admit: 2021-07-28 | Discharge: 2021-07-28 | Disposition: A | Discharge status: Home / Self Care | Payer: Medicare Other | Source: Ambulatory Visit | Attending: Radiation Oncology | Admitting: Radiation Oncology

## 2021-07-28 VITALS — BP 142/93 | HR 53 | Temp 97.3°F | Wt 169.5 lb

## 2021-07-28 DIAGNOSIS — Z923 Personal history of irradiation: Secondary | ICD-10-CM | POA: Diagnosis not present

## 2021-07-28 DIAGNOSIS — C61 Malignant neoplasm of prostate: Secondary | ICD-10-CM | POA: Diagnosis not present

## 2021-07-28 DIAGNOSIS — N529 Male erectile dysfunction, unspecified: Secondary | ICD-10-CM | POA: Diagnosis not present

## 2021-07-28 NOTE — Progress Notes (Signed)
Radiation Oncology Follow up Note  Name: Jonathan Garza   Date:   07/28/2021 MRN:  [id number] DOB: [birth year]    This 55 y.o. male presents to the clinic today for 52-month follow-up status post I-125 interstitial implant for Gleason 6 (3+3) adenocarcinoma the prostate presenting with a PSA of 6.9.  REFERRING PROVIDER: [facility]*  HPI: Patient is a 55 year old male now out 16 months.  Having completed I-125 interstitial implant for Gleason 6 adenocarcinoma the prostate presenting with a PSA of 6.9.  Seen today in routine follow-up follow-up he is asymptomatic specifically denies any increased lower urinary tract symptoms diarrhea or fatigue.  His most recent PSA was 3.8 slightly up from 3.07 months prior.  We are tracking this is a major bump in his PSA and that is closely being watched by urology with a repeat PSA in 3 months.  He is currently on sildenafil for erectile dysfunction.  COMPLICATIONS OF TREATMENT: none  FOLLOW UP COMPLIANCE: keeps appointments   PHYSICAL EXAM:  BP (!) 142/93   Pulse (!) 53   Temp (!) 97.3 F (36.3 C)   Wt 169 lb 8 oz (76.9 kg)   BMI 28.21 kg/m  Well-developed well-nourished patient in NAD. HEENT reveals PERLA, EOMI, discs not visualized.  Oral cavity is clear. No oral mucosal lesions are identified. Neck is clear without evidence of cervical or supraclavicular adenopathy. Lungs are clear to A&P. Cardiac examination is essentially unremarkable with regular rate and rhythm without murmur rub or thrill. Abdomen is benign with no organomegaly or masses noted. Motor sensory and DTR levels are equal and symmetric in the upper and lower extremities. Cranial nerves II through XII are grossly intact. Proprioception is intact. No peripheral adenopathy or edema is identified. No motor or sensory levels are noted. Crude visual fields are within normal range.  RADIOLOGY RESULTS: No current films to review  PLAN: Present time he has a slight trend upward  of his PSA which has not normalized as much as we would expect it.  He will repeat his PSA in 3 months and is being followed by urology.  At that time he may be referred to medical oncology.  At that time also would recommend starting ADT therapy.  Should he have progression of his PSA.  I have asked to see him back in 6 months for follow-up.  Patient knows to call with any concerns.  I would like to take this opportunity to thank you for allowing me to participate in the care of your patient.[name], MD

## 2021-11-07 ENCOUNTER — Other Ambulatory Visit: Payer: Medicare Other

## 2021-11-07 ENCOUNTER — Other Ambulatory Visit: Payer: Self-pay

## 2021-11-07 DIAGNOSIS — C61 Malignant neoplasm of prostate: Secondary | ICD-10-CM

## 2021-11-08 LAB — PSA: Prostate Specific Ag, Serum: 2.1 ng/mL (ref 0.0–4.0)

## 2021-11-10 ENCOUNTER — Telehealth: Payer: Self-pay

## 2021-11-10 DIAGNOSIS — C61 Malignant neoplasm of prostate: Secondary | ICD-10-CM

## 2021-11-10 NOTE — Telephone Encounter (Signed)
-----   Message from Billey Co, MD sent at 11/08/2021  9:07 AM EST ----- ?Good news, PSA down to 2.1.  Keep scheduled follow-up with radiation oncology, and follow-up with urology in 9 months with PSA prior ? ?Nickolas Madrid, MD ?11/08/2021 ? ? ?

## 2021-11-10 NOTE — Telephone Encounter (Signed)
Called pt informed him of the information below. Pt voiced understanding. PSA ordered. Appt scheduled.  

## 2022-01-28 ENCOUNTER — Encounter: Payer: Self-pay | Admitting: Radiation Oncology

## 2022-01-28 ENCOUNTER — Ambulatory Visit
Admission: RE | Admit: 2022-01-28 | Discharge: 2022-01-28 | Disposition: A | Discharge status: Home / Self Care | Payer: Medicare Other | Source: Ambulatory Visit | Attending: Radiation Oncology | Admitting: Radiation Oncology

## 2022-01-28 VITALS — BP 127/91 | HR 52 | Temp 96.5°F | Resp 18 | Ht 65.0 in | Wt 163.7 lb

## 2022-01-28 DIAGNOSIS — Z923 Personal history of irradiation: Secondary | ICD-10-CM | POA: Diagnosis not present

## 2022-01-28 DIAGNOSIS — C61 Malignant neoplasm of prostate: Secondary | ICD-10-CM | POA: Insufficient documentation

## 2022-01-28 DIAGNOSIS — N529 Male erectile dysfunction, unspecified: Secondary | ICD-10-CM | POA: Insufficient documentation

## 2022-01-28 NOTE — Progress Notes (Signed)
Radiation Oncology Follow up Note  Name: Jonathan Garza   Date:   01/28/2022 MRN:  [id number] DOB: [birth year]    This 56 y.o. male presents to the clinic today for 2-year follow-up status post I-125 interstitial implant for Gleason 6 (3+3) adenocarcinoma the prostate presenting with a PSA of 6.9.  REFERRING PROVIDER: [facility]  HPI: Patient is a 56 year old male now out 2 years having completed I-125 interstitial implant for Gleason 6 adenocarcinoma the prostate seen today in routine follow-up he is doing well.  He specifically denies increased lower Neri tract symptoms diarrhea or fatigue.Marland Kitchen  He is currently being treated for erectile dysfunction through urology.  His PSA had bumped back in November was 3.8 although recently was 2.1.  COMPLICATIONS OF TREATMENT: none  FOLLOW UP COMPLIANCE: keeps appointments   PHYSICAL EXAM:  BP (!) 127/91   Pulse (!) 52   Temp (!) 96.5 F (35.8 C)   Resp 18   Ht '5\' 5"'$  (1.651 m)   Wt 163 lb 11.2 oz (74.3 kg)   BMI 27.24 kg/m  Well-developed well-nourished patient in NAD. HEENT reveals PERLA, EOMI, discs not visualized.  Oral cavity is clear. No oral mucosal lesions are identified. Neck is clear without evidence of cervical or supraclavicular adenopathy. Lungs are clear to A&P. Cardiac examination is essentially unremarkable with regular rate and rhythm without murmur rub or thrill. Abdomen is benign with no organomegaly or masses noted. Motor sensory and DTR levels are equal and symmetric in the upper and lower extremities. Cranial nerves II through XII are grossly intact. Proprioception is intact. No peripheral adenopathy or edema is identified. No motor or sensory levels are noted. Crude visual fields are within normal range.  RADIOLOGY RESULTS: No current films to review  PLAN: Present time patient is doing well his PSA is declined by 50% over the last 6 months showing excellent trend.  Pleased with his overall progress I will see him  again in 6 months for follow-up.  Patient knows to call with any concerns.  He continues close follow-up care with urology.  I would like to take this opportunity to thank you for allowing me to participate in the care of your patient.[name], MD

## 2022-07-16 ENCOUNTER — Other Ambulatory Visit: Payer: Self-pay | Admitting: *Deleted

## 2022-07-16 DIAGNOSIS — C61 Malignant neoplasm of prostate: Secondary | ICD-10-CM

## 2022-07-20 ENCOUNTER — Inpatient Hospital Stay: Payer: Medicare Other | Attending: Radiation Oncology

## 2022-07-20 DIAGNOSIS — C61 Malignant neoplasm of prostate: Secondary | ICD-10-CM | POA: Diagnosis present

## 2022-07-20 LAB — PSA: Prostatic Specific Antigen: 1.02 ng/mL (ref 0.00–4.00)

## 2022-07-30 ENCOUNTER — Encounter: Payer: Self-pay | Admitting: Radiation Oncology

## 2022-07-30 ENCOUNTER — Ambulatory Visit
Admission: RE | Admit: 2022-07-30 | Discharge: 2022-07-30 | Disposition: A | Discharge status: Home / Self Care | Payer: Medicare Other | Source: Ambulatory Visit | Attending: Radiation Oncology | Admitting: Radiation Oncology

## 2022-07-30 VITALS — BP 114/70 | HR 63 | Temp 97.8°F | Resp 12 | Ht 65.0 in | Wt 167.0 lb

## 2022-07-30 DIAGNOSIS — Z923 Personal history of irradiation: Secondary | ICD-10-CM | POA: Diagnosis not present

## 2022-07-30 DIAGNOSIS — C61 Malignant neoplasm of prostate: Secondary | ICD-10-CM | POA: Insufficient documentation

## 2022-07-30 NOTE — Progress Notes (Signed)
Radiation Oncology Follow up Note  Name: Jonathan Garza   Date:   07/30/2022 MRN:  [id number] DOB: Apr 27, 1966    This 56 y.o. male presents to the clinic today for 2 and half year follow-up status post I-125 interstitial implant for Gleason 6 (3+3) adenocarcinoma the prostate presenting with a PSA of 6.9..  REFERRING PROVIDER: [facility]*  HPI: Patient is a 56 year old male now out over 2 and half years having completed I-125 interstitial implant for Gleason 6 adenocarcinoma prostate seen today in routine follow-up he is doing well specifically denies any increased lower urinary tract symptoms diarrhea or bone pain.  His PSA continues to decline is 1.0 this month down from 2.1 back in March 2023.Marland Kitchen  COMPLICATIONS OF TREATMENT: none  FOLLOW UP COMPLIANCE: keeps appointments   PHYSICAL EXAM:  BP 114/70 (BP Location: Right Arm, Patient Position: Sitting, Cuff Size: Normal)   Pulse 63   Temp 97.8 F (36.6 C) (Tympanic)   Resp 12   Ht '5\' 5"'$  (1.651 m) Comment: stated ht  Wt 167 lb (75.8 kg)   BMI 27.79 kg/m  Well-developed well-nourished patient in NAD. HEENT reveals PERLA, EOMI, discs not visualized.  Oral cavity is clear. No oral mucosal lesions are identified. Neck is clear without evidence of cervical or supraclavicular adenopathy. Lungs are clear to A&P. Cardiac examination is essentially unremarkable with regular rate and rhythm without murmur rub or thrill. Abdomen is benign with no organomegaly or masses noted. Motor sensory and DTR levels are equal and symmetric in the upper and lower extremities. Cranial nerves II through XII are grossly intact. Proprioception is intact. No peripheral adenopathy or edema is identified. No motor or sensory levels are noted. Crude visual fields are within normal range.  RADIOLOGY RESULTS: No current films for review  PLAN: At the present time patient is doing well under excellent biochemical control of his prostate cancer now at 2 and  half years.  I have asked to see him back in 1 year for follow-up with a repeat PSA.  Patient is to call with any concerns.  I would like to take this opportunity to thank you for allowing me to participate in the care of your patient.[name], MD

## 2022-08-14 ENCOUNTER — Other Ambulatory Visit: Payer: Medicare Other

## 2022-08-14 DIAGNOSIS — C61 Malignant neoplasm of prostate: Secondary | ICD-10-CM

## 2022-08-15 LAB — PSA: Prostate Specific Ag, Serum: 1.1 ng/mL (ref 0.0–4.0)

## 2022-08-27 ENCOUNTER — Ambulatory Visit (INDEPENDENT_AMBULATORY_CARE_PROVIDER_SITE_OTHER): Payer: Medicare Other | Admitting: Urology

## 2022-08-27 ENCOUNTER — Encounter: Payer: Self-pay | Admitting: Urology

## 2022-08-27 VITALS — BP 129/82 | HR 80 | Ht 65.0 in | Wt 163.0 lb

## 2022-08-27 DIAGNOSIS — N529 Male erectile dysfunction, unspecified: Secondary | ICD-10-CM | POA: Diagnosis not present

## 2022-08-27 DIAGNOSIS — C61 Malignant neoplasm of prostate: Secondary | ICD-10-CM | POA: Diagnosis not present

## 2022-08-27 MED ORDER — SILDENAFIL CITRATE 20 MG PO TABS: 20.0000 mg | ORAL_TABLET | Freq: Every day | ORAL | 3 refills | Status: AC | PRN

## 2022-08-27 NOTE — Progress Notes (Signed)
   08/27/2022 4:39 PM   [name] 09/06/1965 [id number]  Reason for visit: Follow up prostate cancer, ED, nocturia   HPI: [name] is a 56 year old male with a strong family history of lethal prostate cancer who was diagnosed with low risk prostate cancer, but opted for brachytherapy in the setting of a suspicious PI-RADS 4 lesion on prostate MRI with bulging at the capsule.  He underwent brachytherapy implant on 03/07/2020.  Preoperative PSA was 6.9, and PSA 3 months postoperatively was 2.1.   PSA has continued to downtrend, most recently 1.1 on 08/14/2022.  He did have a bump in the PSA to 3.8 in November 2022, but this decreased to 2.1 at follow-up.   He is not having any significant urinary complaints at this time.  He has nocturia 3-5 times per night, but this is secondary to his ongoing consumption of 4-5 beers in the evening.  He is no longer taking Flomax.  We reviewed behavioral strategies of decreasing alcohol consumption, minimizing fluids prior to bedtime, and timed voiding.   Regarding his erectile dysfunction, he takes a 20 mg sildenafil on demand with good results.   -We discussed the need to continue to closely monitor the PSA, and the importance of the overall trend.   -Sildenafil refilled to [email or web address], RTC 1 year PSA prior  Billey Co, MD  [facility] [address], [place], [place] [address] [phone]

## 2022-08-27 NOTE — Patient Instructions (Signed)
Please set up account for [email or web address] to get prescription

## 2023-02-01 IMAGING — CR DG KNEE COMPLETE 4+V*L*
1 series · 1 of 1 positions shown · non-contrast
Comparison: None.

CLINICAL DATA: Left knee pain status post trauma

EXAM:
LEFT KNEE - COMPLETE 4+ VIEW

[knee lat]
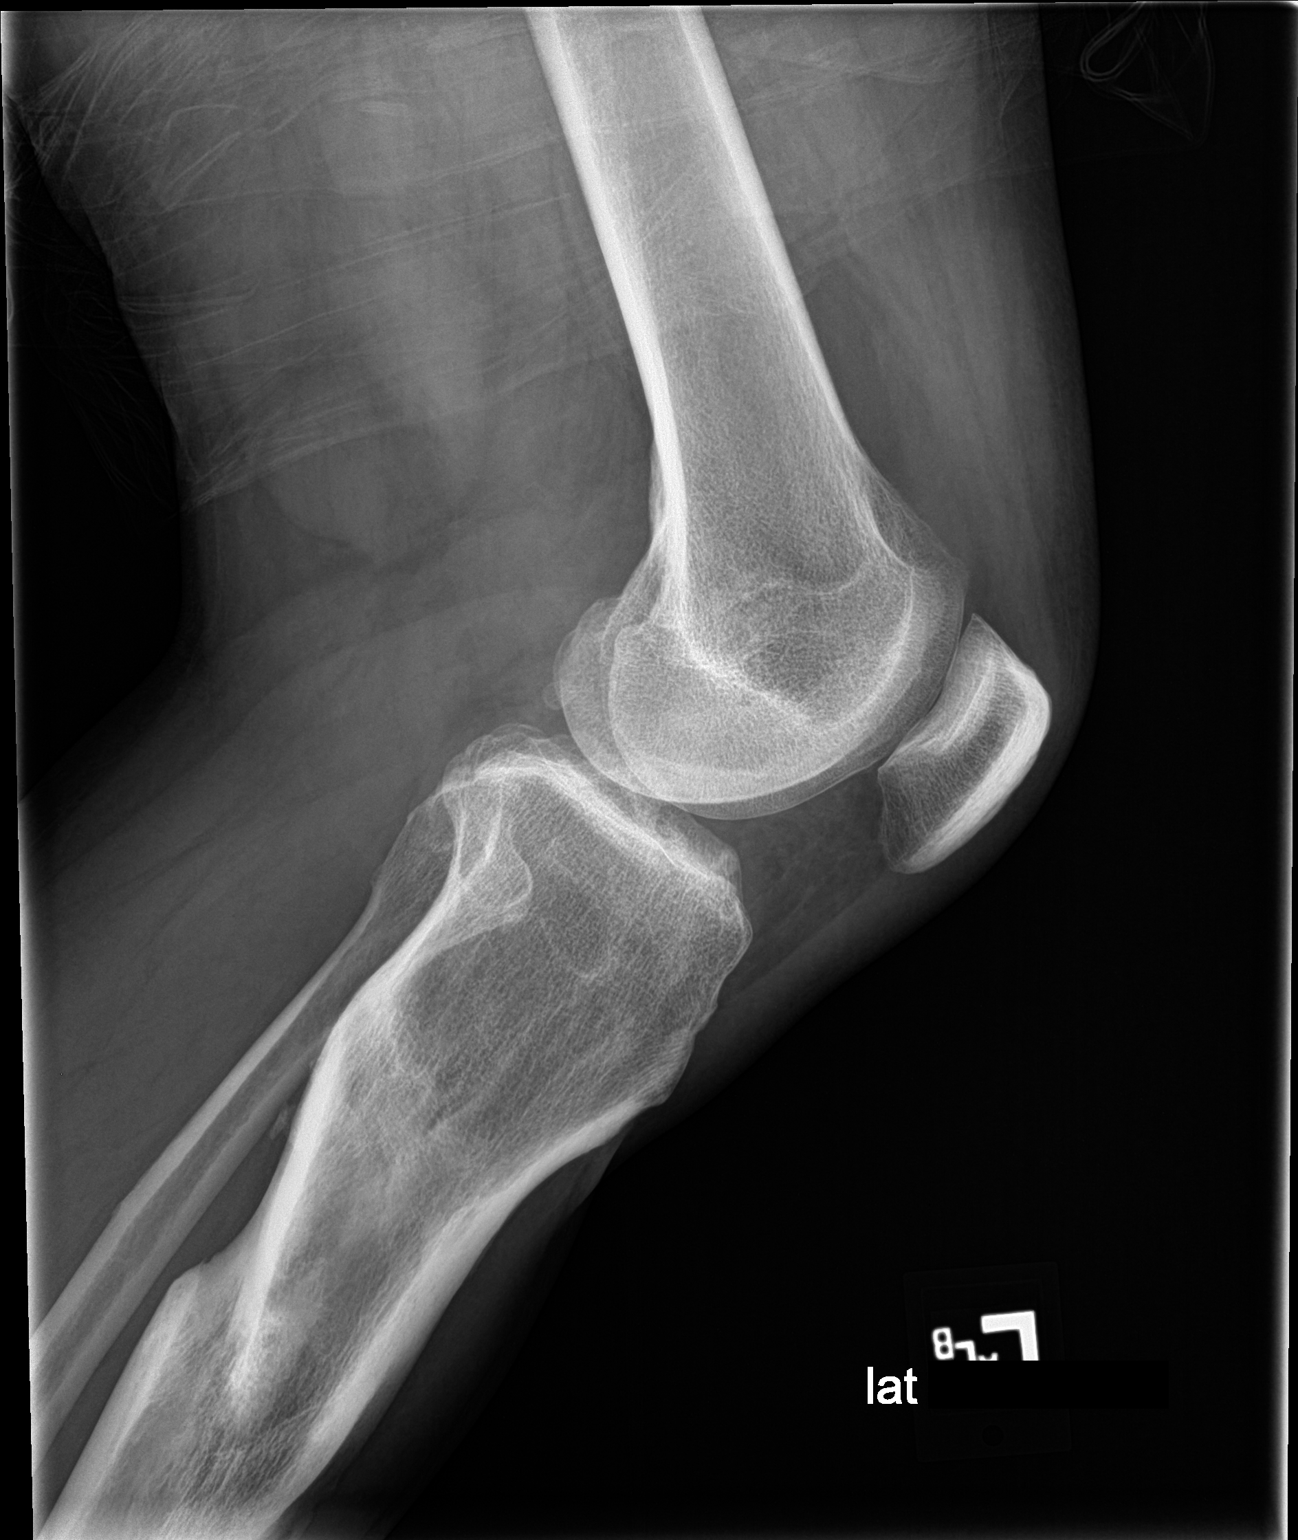

[1 of 1 positions shown; findings below may reference images not displayed]

FINDINGS: Deformity of the proximal tibial diaphysis indicative of prior
healed fracture. Osseous excrescence along the medial aspect of the
medial femoral condyle likely due to prior medial collateral
ligament injury. Mild spurring of the medial compartment.
IMPRESSION: No acute fracture or dislocation of the left knee.

## 2023-09-02 ENCOUNTER — Encounter: Payer: Self-pay | Admitting: Urology

## 2023-09-02 ENCOUNTER — Ambulatory Visit (INDEPENDENT_AMBULATORY_CARE_PROVIDER_SITE_OTHER): Payer: 59 | Admitting: Urology

## 2023-09-02 VITALS — BP 124/77 | HR 59 | Ht 65.0 in | Wt 161.0 lb

## 2023-09-02 DIAGNOSIS — R351 Nocturia: Secondary | ICD-10-CM

## 2023-09-02 DIAGNOSIS — C61 Malignant neoplasm of prostate: Secondary | ICD-10-CM

## 2023-09-02 DIAGNOSIS — N529 Male erectile dysfunction, unspecified: Secondary | ICD-10-CM | POA: Diagnosis not present

## 2023-09-02 NOTE — Patient Instructions (Signed)
 Nocturia refers to the need to wake up during the night to urinate, which can disrupt your sleep and impact your overall well-being. Fortunately, there are several strategies you can employ to help prevent or manage nocturia. It's important to consult with your healthcare provider before making any significant changes to your routine. Here are some helpful strategies to consider:  Limit Fluid Intake Before Bed: Avoid drinking large amounts of fluids in the evening, especially within a few hours of bedtime. Consume most of your daily fluid intake earlier in the day to reduce the need to urinate at night. -Avoid alcohol in the evenings, as this is a very common contributor to overnight urination, would minimize drinking alcohol 3 to 4 hours before bed  Monitor Your Diet: Limit your intake of caffeine and alcohol, as these substances can increase urine production and irritate the bladder.  Avoid diet, zero calorie, and artificially sweetened drinks, especially sodas, in the afternoon or evening. Be mindful of consuming foods and drinks with high water content before bedtime, such as watermelon and herbal teas.  Time Your Medications: If you're taking medications that contribute to increased urination, consult your healthcare provider about adjusting the timing of these medications to minimize their impact during the night.  Practice Double Voiding: Before going to bed, make an effort to empty your bladder twice within a short period. This can help reduce the amount of urine left in your bladder before sleep.  Bladder Training: Gradually increase the time between bathroom visits during the day to train your bladder to hold larger volumes of urine. Over time, this can help reduce the frequency of nighttime awakenings to urinate.  Elevate Your Legs During the Day: Elevating your legs during the day can help minimize fluid retention in your lower extremities, which might reduce nighttime  urination.  Pelvic Floor Exercises: Strengthening your pelvic floor muscles through Kegel exercises can help improve bladder control and potentially reduce the urge to urinate at night.  Create a Relaxing Bedtime Routine: Stress and anxiety can exacerbate nocturia. Engage in calming activities before bed, such as reading, listening to soothing music, or practicing relaxation techniques.  Stay Active: Engage in regular physical activity, but avoid intense exercise close to bedtime, as this can increase your body's demand for fluids.  Maintain a Healthy Weight: Excess weight can compress the bladder and contribute to bladder and urinary issues. Aim to achieve and maintain a healthy weight through a balanced diet and regular exercise.  Remember that every individual is unique, and the effectiveness of these strategies may vary. It's important to work with your healthcare provider to develop a plan that suits your specific needs and addresses any underlying causes of nocturia.

## 2023-09-02 NOTE — Progress Notes (Signed)
   09/02/2023 2:54 PM   Darren Cowden Nov 11, 1965 [id number]  Reason for visit: Follow up prostate cancer, ED, nocturia   HPI: Mr. Vitelli is a 58 year old male with a strong family history of lethal prostate cancer who was diagnosed with low risk prostate cancer, but opted for brachytherapy in the setting of a suspicious PI-RADS 4 lesion on prostate MRI with bulging at the capsule.  He underwent brachytherapy implant on 03/07/2020.  Preoperative PSA was 6.9, and PSA 3 months postoperatively was 2.1.   PSA has continued to downtrend, most recently 1.1 on 08/14/2022.  PSA today is still pending.  He did have a bump in the PSA to 3.8 in November 2022, but this decreased to 2.1 at follow-up.   He is not having any significant urinary complaints at this time.  He has nocturia 2 times per night, but this is secondary to his ongoing consumption of 4-5 beers in the evening.  He is no longer taking Flomax .  We reviewed behavioral strategies of decreasing alcohol consumption, minimizing fluids prior to bedtime, and timed voiding.   Regarding his erectile dysfunction, previously was using sildenafil  20 mg on demand.  Recently, feels he does not need that medication anymore, sounds like he is taking an over-the-counter chewable supplement with good results.  Offered Cialis or sildenafil  refilled and he deferred.   -We discussed the need to continue to closely monitor the PSA, and the importance of the overall trend.   -Okay to refill PDE 5 inhibitor in the future if patient desires -Call with PSA today, if stable continue yearly PSA monitoring  Redell JAYSON Burnet, MD  [facility] [address] [place], [place] [address] [phone]

## 2023-09-03 LAB — PSA: Prostate Specific Ag, Serum: 0.4 ng/mL (ref 0.0–4.0)

## 2024-04-13 ENCOUNTER — Encounter: Payer: Self-pay | Admitting: Urology

## 2024-09-01 ENCOUNTER — Other Ambulatory Visit: Payer: Self-pay

## 2024-09-01 DIAGNOSIS — C61 Malignant neoplasm of prostate: Secondary | ICD-10-CM

## 2024-09-02 LAB — PSA: Prostate Specific Ag, Serum: <0.1 ng/mL (ref 0.0–4.0)

## 2024-09-06 ENCOUNTER — Ambulatory Visit: Admitting: Urology

## 2024-09-06 VITALS — BP 135/92 | HR 72 | Ht 65.0 in | Wt 160.0 lb

## 2024-09-06 DIAGNOSIS — Z8042 Family history of malignant neoplasm of prostate: Secondary | ICD-10-CM | POA: Diagnosis not present

## 2024-09-06 DIAGNOSIS — C61 Malignant neoplasm of prostate: Secondary | ICD-10-CM

## 2024-09-06 NOTE — Progress Notes (Signed)
" ° °  09/06/2024 4:53 PM   Darren Cowden 01-11-1966 [id number]  Reason for visit: Follow up prostate cancer, ED, nocturia   HPI: Mr. Valent is a 59 year old male with a strong family history of lethal prostate cancer who was diagnosed with low risk prostate cancer, but opted for brachytherapy in the setting of a suspicious PI-RADS 4 lesion on prostate MRI with bulging at the capsule.  He underwent brachytherapy implant on 03/07/2020.  Preoperative PSA was 6.9, and PSA 3 months postoperatively was 2.1.   PSA has continued to downtrend, most recently undetectable from January 2026.  He did have a PSA bounce up to 3.8 in November 2022 likely related to benign brachytherapy PSA bounce.   He is not having any significant urinary complaints at this time.    ED has resolved, no longer using medications for this   -Continue yearly PSA monitoring with PCP, refer back to urology if PSA greater than 2.0  Redell JAYSON Burnet, MD  [facility] [address] [place], [place] [address] [phone]      "

## 2024-09-06 NOTE — Patient Instructions (Signed)
 Please have your primary care doctor check your PSA blood test every year.  If this goes above 2.0 please reach out to our clinic.  We are happy to see you again if you have any other urology problems

## 2024-09-07 ENCOUNTER — Ambulatory Visit: Payer: Self-pay | Admitting: Urology
